# Patient Record
Sex: Female | Born: 1940 | Race: White | Hispanic: No | Marital: Single | Smoking: Never smoker
Health system: Southern US, Community
[De-identification: ages and names within clinical notes are randomized; demographics above are authoritative.]

## PROBLEM LIST (undated history)

## (undated) DIAGNOSIS — E039 Hypothyroidism, unspecified: Secondary | ICD-10-CM

## (undated) DIAGNOSIS — F419 Anxiety disorder, unspecified: Secondary | ICD-10-CM

## (undated) DIAGNOSIS — D510 Vitamin B12 deficiency anemia due to intrinsic factor deficiency: Secondary | ICD-10-CM

## (undated) DIAGNOSIS — N189 Chronic kidney disease, unspecified: Secondary | ICD-10-CM

## (undated) DIAGNOSIS — D649 Anemia, unspecified: Secondary | ICD-10-CM

## (undated) DIAGNOSIS — F330 Major depressive disorder, recurrent, mild: Secondary | ICD-10-CM

## (undated) DIAGNOSIS — L900 Lichen sclerosus et atrophicus: Secondary | ICD-10-CM

## (undated) DIAGNOSIS — I10 Essential (primary) hypertension: Secondary | ICD-10-CM

## (undated) DIAGNOSIS — M5417 Radiculopathy, lumbosacral region: Secondary | ICD-10-CM

## (undated) DIAGNOSIS — M818 Other osteoporosis without current pathological fracture: Secondary | ICD-10-CM

## (undated) DIAGNOSIS — E538 Deficiency of other specified B group vitamins: Secondary | ICD-10-CM

## (undated) DIAGNOSIS — K449 Diaphragmatic hernia without obstruction or gangrene: Secondary | ICD-10-CM

## (undated) DIAGNOSIS — F410 Panic disorder [episodic paroxysmal anxiety] without agoraphobia: Secondary | ICD-10-CM

## (undated) DIAGNOSIS — E119 Type 2 diabetes mellitus without complications: Secondary | ICD-10-CM

## (undated) DIAGNOSIS — M797 Fibromyalgia: Secondary | ICD-10-CM

## (undated) DIAGNOSIS — C4371 Malignant melanoma of right lower limb, including hip: Secondary | ICD-10-CM

## (undated) DIAGNOSIS — E785 Hyperlipidemia, unspecified: Secondary | ICD-10-CM

## (undated) DIAGNOSIS — K219 Gastro-esophageal reflux disease without esophagitis: Secondary | ICD-10-CM

## (undated) HISTORY — DX: Chronic kidney disease, unspecified: N18.9

## (undated) HISTORY — DX: Major depressive disorder, recurrent, mild: F33.0

## (undated) HISTORY — DX: Lichen sclerosus et atrophicus: L90.0

## (undated) HISTORY — PX: KNEE SURGERY: SHX244

## (undated) HISTORY — DX: Deficiency of other specified B group vitamins: E53.8

## (undated) HISTORY — DX: Hyperlipidemia, unspecified: E78.5

## (undated) HISTORY — DX: Hypothyroidism, unspecified: E03.9

## (undated) HISTORY — DX: Malignant melanoma of right lower limb, including hip: C43.71

## (undated) HISTORY — DX: Vitamin B12 deficiency anemia due to intrinsic factor deficiency: D51.0

## (undated) HISTORY — DX: Radiculopathy, lumbosacral region: M54.17

## (undated) HISTORY — PX: THYROIDECTOMY: SHX17

## (undated) HISTORY — DX: Other osteoporosis without current pathological fracture: M81.8

---

## 2004-08-14 ENCOUNTER — Ambulatory Visit: Payer: Self-pay | Admitting: Unknown Physician Specialty

## 2004-09-24 ENCOUNTER — Ambulatory Visit: Payer: Self-pay | Admitting: General Practice

## 2004-11-09 ENCOUNTER — Encounter: Payer: Self-pay | Admitting: General Practice

## 2004-11-25 ENCOUNTER — Encounter: Payer: Self-pay | Admitting: General Practice

## 2005-04-12 ENCOUNTER — Encounter: Payer: Self-pay | Admitting: Internal Medicine

## 2005-04-24 ENCOUNTER — Encounter: Payer: Self-pay | Admitting: Internal Medicine

## 2005-05-25 ENCOUNTER — Encounter: Payer: Self-pay | Admitting: Internal Medicine

## 2005-06-25 ENCOUNTER — Encounter: Payer: Self-pay | Admitting: Internal Medicine

## 2005-07-25 ENCOUNTER — Encounter: Payer: Self-pay | Admitting: Internal Medicine

## 2005-08-06 ENCOUNTER — Ambulatory Visit: Payer: Self-pay

## 2005-08-25 ENCOUNTER — Encounter: Payer: Self-pay | Admitting: Internal Medicine

## 2005-09-24 ENCOUNTER — Encounter: Payer: Self-pay | Admitting: Internal Medicine

## 2005-10-25 ENCOUNTER — Encounter: Payer: Self-pay | Admitting: Internal Medicine

## 2005-11-25 ENCOUNTER — Encounter: Payer: Self-pay | Admitting: Internal Medicine

## 2005-12-23 ENCOUNTER — Encounter: Payer: Self-pay | Admitting: Internal Medicine

## 2006-01-23 ENCOUNTER — Encounter: Payer: Self-pay | Admitting: Internal Medicine

## 2006-02-18 ENCOUNTER — Ambulatory Visit: Payer: Self-pay | Admitting: Internal Medicine

## 2006-02-22 ENCOUNTER — Encounter: Payer: Self-pay | Admitting: Internal Medicine

## 2006-03-25 ENCOUNTER — Encounter: Payer: Self-pay | Admitting: Internal Medicine

## 2006-04-24 ENCOUNTER — Encounter: Payer: Self-pay | Admitting: Internal Medicine

## 2006-05-25 ENCOUNTER — Encounter: Payer: Self-pay | Admitting: Internal Medicine

## 2006-06-25 ENCOUNTER — Encounter: Payer: Self-pay | Admitting: Internal Medicine

## 2006-07-25 ENCOUNTER — Encounter: Payer: Self-pay | Admitting: Internal Medicine

## 2006-08-08 ENCOUNTER — Ambulatory Visit: Payer: Self-pay | Admitting: Unknown Physician Specialty

## 2006-08-25 ENCOUNTER — Encounter: Payer: Self-pay | Admitting: Internal Medicine

## 2006-09-24 ENCOUNTER — Encounter: Payer: Self-pay | Admitting: Internal Medicine

## 2006-10-11 ENCOUNTER — Ambulatory Visit: Payer: Self-pay | Admitting: Internal Medicine

## 2006-10-25 ENCOUNTER — Encounter: Payer: Self-pay | Admitting: Internal Medicine

## 2006-11-25 ENCOUNTER — Encounter: Payer: Self-pay | Admitting: Internal Medicine

## 2006-12-24 ENCOUNTER — Encounter: Payer: Self-pay | Admitting: Internal Medicine

## 2007-01-24 ENCOUNTER — Encounter: Payer: Self-pay | Admitting: Internal Medicine

## 2007-02-15 ENCOUNTER — Ambulatory Visit: Payer: Self-pay | Admitting: Internal Medicine

## 2007-02-23 ENCOUNTER — Encounter: Payer: Self-pay | Admitting: Internal Medicine

## 2007-03-26 ENCOUNTER — Encounter: Payer: Self-pay | Admitting: Internal Medicine

## 2007-04-25 ENCOUNTER — Encounter: Payer: Self-pay | Admitting: Internal Medicine

## 2007-05-22 ENCOUNTER — Ambulatory Visit: Payer: Self-pay

## 2008-02-28 ENCOUNTER — Ambulatory Visit: Payer: Self-pay | Admitting: Internal Medicine

## 2008-04-10 ENCOUNTER — Ambulatory Visit: Payer: Self-pay

## 2008-08-22 ENCOUNTER — Ambulatory Visit: Payer: Self-pay | Admitting: Internal Medicine

## 2009-02-24 ENCOUNTER — Ambulatory Visit: Payer: Self-pay | Admitting: Internal Medicine

## 2009-03-05 ENCOUNTER — Ambulatory Visit: Payer: Self-pay | Admitting: Internal Medicine

## 2009-04-14 ENCOUNTER — Ambulatory Visit: Payer: Self-pay | Admitting: Unknown Physician Specialty

## 2009-06-05 ENCOUNTER — Ambulatory Visit: Payer: Self-pay | Admitting: Gastroenterology

## 2009-08-25 ENCOUNTER — Other Ambulatory Visit: Payer: Self-pay | Admitting: Internal Medicine

## 2010-02-24 ENCOUNTER — Other Ambulatory Visit: Payer: Self-pay | Admitting: Internal Medicine

## 2010-04-22 ENCOUNTER — Ambulatory Visit: Payer: Self-pay | Admitting: Unknown Physician Specialty

## 2010-09-02 ENCOUNTER — Other Ambulatory Visit: Payer: Self-pay | Admitting: Internal Medicine

## 2011-03-02 ENCOUNTER — Other Ambulatory Visit: Payer: Self-pay | Admitting: Internal Medicine

## 2011-04-29 ENCOUNTER — Ambulatory Visit: Payer: Self-pay | Admitting: Unknown Physician Specialty

## 2012-03-02 ENCOUNTER — Other Ambulatory Visit: Payer: Self-pay | Admitting: Internal Medicine

## 2012-03-02 LAB — CBC WITH DIFFERENTIAL/PLATELET
Basophil %: 0.8 %
Eosinophil #: 0.2 10*3/uL (ref 0.0–0.7)
Eosinophil %: 3 %
HCT: 34.9 % — ABNORMAL LOW (ref 35.0–47.0)
Lymphocyte #: 1.9 10*3/uL (ref 1.0–3.6)
MCH: 30 pg (ref 26.0–34.0)
MCHC: 33.1 g/dL (ref 32.0–36.0)
Monocyte #: 0.7 x10 3/mm (ref 0.2–0.9)
Monocyte %: 10.7 %
Neutrophil #: 3.9 10*3/uL (ref 1.4–6.5)
Platelet: 273 10*3/uL (ref 150–440)
RDW: 12.8 % (ref 11.5–14.5)
WBC: 6.8 10*3/uL (ref 3.6–11.0)

## 2012-03-02 LAB — LIPID PANEL
Cholesterol: 176 mg/dL (ref 0–200)
HDL Cholesterol: 67 mg/dL — ABNORMAL HIGH (ref 40–60)
Triglycerides: 131 mg/dL (ref 0–200)

## 2012-03-02 LAB — URINALYSIS, COMPLETE
Blood: NEGATIVE
Ketone: NEGATIVE
Leukocyte Esterase: NEGATIVE
Ph: 7 (ref 4.5–8.0)
RBC,UR: NONE SEEN /HPF (ref 0–5)
Specific Gravity: 1.006 (ref 1.003–1.030)
Squamous Epithelial: NONE SEEN

## 2012-03-02 LAB — COMPREHENSIVE METABOLIC PANEL
Albumin: 3.9 g/dL (ref 3.4–5.0)
Anion Gap: 8 (ref 7–16)
Bilirubin,Total: 0.4 mg/dL (ref 0.2–1.0)
Creatinine: 1 mg/dL (ref 0.60–1.30)
EGFR (Non-African Amer.): 56 — ABNORMAL LOW
Osmolality: 274 (ref 275–301)
SGOT(AST): 23 U/L (ref 15–37)
SGPT (ALT): 23 U/L
Sodium: 136 mmol/L (ref 136–145)
Total Protein: 7.2 g/dL (ref 6.4–8.2)

## 2012-05-02 ENCOUNTER — Ambulatory Visit: Payer: Self-pay | Admitting: Obstetrics and Gynecology

## 2012-09-04 ENCOUNTER — Other Ambulatory Visit: Payer: Self-pay | Admitting: Internal Medicine

## 2012-09-04 LAB — CBC WITH DIFFERENTIAL/PLATELET
Basophil #: 0 10*3/uL (ref 0.0–0.1)
Basophil %: 0.5 %
Eosinophil #: 0.1 10*3/uL (ref 0.0–0.7)
HCT: 34.8 % — ABNORMAL LOW (ref 35.0–47.0)
HGB: 11.6 g/dL — ABNORMAL LOW (ref 12.0–16.0)
Lymphocyte #: 2.2 10*3/uL (ref 1.0–3.6)
MCH: 30.5 pg (ref 26.0–34.0)
MCHC: 33.4 g/dL (ref 32.0–36.0)
MCV: 91 fL (ref 80–100)
Monocyte #: 0.8 x10 3/mm (ref 0.2–0.9)
Neutrophil #: 4.8 10*3/uL (ref 1.4–6.5)
RDW: 13.8 % (ref 11.5–14.5)

## 2012-09-04 LAB — LIPID PANEL
Cholesterol: 186 mg/dL (ref 0–200)
HDL Cholesterol: 81 mg/dL — ABNORMAL HIGH (ref 40–60)
Triglycerides: 107 mg/dL (ref 0–200)

## 2012-09-04 LAB — COMPREHENSIVE METABOLIC PANEL
Albumin: 3.7 g/dL (ref 3.4–5.0)
Alkaline Phosphatase: 80 U/L (ref 50–136)
BUN: 21 mg/dL — ABNORMAL HIGH (ref 7–18)
Bilirubin,Total: 0.4 mg/dL (ref 0.2–1.0)
Calcium, Total: 9.3 mg/dL (ref 8.5–10.1)
Co2: 26 mmol/L (ref 21–32)
EGFR (Non-African Amer.): 49 — ABNORMAL LOW
Glucose: 90 mg/dL (ref 65–99)
Osmolality: 271 (ref 275–301)
SGOT(AST): 23 U/L (ref 15–37)
SGPT (ALT): 24 U/L (ref 12–78)
Sodium: 134 mmol/L — ABNORMAL LOW (ref 136–145)

## 2013-03-13 ENCOUNTER — Other Ambulatory Visit: Payer: Self-pay | Admitting: Internal Medicine

## 2013-03-13 LAB — CBC WITH DIFFERENTIAL/PLATELET
Basophil #: 0.1 10*3/uL (ref 0.0–0.1)
Eosinophil #: 0.2 10*3/uL (ref 0.0–0.7)
Eosinophil %: 2.6 %
HCT: 33.8 % — ABNORMAL LOW (ref 35.0–47.0)
HGB: 11.4 g/dL — ABNORMAL LOW (ref 12.0–16.0)
Lymphocyte #: 2.1 10*3/uL (ref 1.0–3.6)
MCH: 30 pg (ref 26.0–34.0)
MCHC: 33.9 g/dL (ref 32.0–36.0)
MCV: 88 fL (ref 80–100)
Neutrophil #: 4.1 10*3/uL (ref 1.4–6.5)
Platelet: 289 10*3/uL (ref 150–440)
RBC: 3.82 10*6/uL (ref 3.80–5.20)
RDW: 13.1 % (ref 11.5–14.5)

## 2013-03-13 LAB — URINALYSIS, COMPLETE
Bilirubin,UR: NEGATIVE
Blood: NEGATIVE
Glucose,UR: NEGATIVE mg/dL (ref 0–75)
Ketone: NEGATIVE
Protein: NEGATIVE
RBC,UR: NONE SEEN /HPF (ref 0–5)
Squamous Epithelial: <1

## 2013-03-13 LAB — COMPREHENSIVE METABOLIC PANEL
Albumin: 3.8 g/dL (ref 3.4–5.0)
Anion Gap: 6 — ABNORMAL LOW (ref 7–16)
Bilirubin,Total: 0.4 mg/dL (ref 0.2–1.0)
Chloride: 103 mmol/L (ref 98–107)
Creatinine: 1.1 mg/dL (ref 0.60–1.30)
EGFR (African American): 58 — ABNORMAL LOW
EGFR (Non-African Amer.): 50 — ABNORMAL LOW
Glucose: 99 mg/dL (ref 65–99)
Osmolality: 273 (ref 275–301)
Potassium: 4.9 mmol/L (ref 3.5–5.1)
SGOT(AST): 28 U/L (ref 15–37)
Sodium: 135 mmol/L — ABNORMAL LOW (ref 136–145)
Total Protein: 7.2 g/dL (ref 6.4–8.2)

## 2013-03-13 LAB — LIPID PANEL: Ldl Cholesterol, Calc: 87 mg/dL (ref 0–100)

## 2013-03-27 ENCOUNTER — Ambulatory Visit: Payer: Self-pay | Admitting: Internal Medicine

## 2013-05-03 ENCOUNTER — Ambulatory Visit: Payer: Self-pay | Admitting: Obstetrics and Gynecology

## 2014-02-09 DIAGNOSIS — K219 Gastro-esophageal reflux disease without esophagitis: Secondary | ICD-10-CM | POA: Insufficient documentation

## 2014-02-09 DIAGNOSIS — L900 Lichen sclerosus et atrophicus: Secondary | ICD-10-CM | POA: Insufficient documentation

## 2014-02-09 DIAGNOSIS — I10 Essential (primary) hypertension: Secondary | ICD-10-CM

## 2014-03-20 ENCOUNTER — Other Ambulatory Visit: Payer: Self-pay | Admitting: Internal Medicine

## 2014-03-20 LAB — URINALYSIS, COMPLETE
BACTERIA: NONE SEEN
Bilirubin,UR: NEGATIVE
Blood: NEGATIVE
GLUCOSE, UR: NEGATIVE mg/dL (ref 0–75)
KETONE: NEGATIVE
Leukocyte Esterase: NEGATIVE
Nitrite: NEGATIVE
Ph: 6 (ref 4.5–8.0)
Protein: NEGATIVE
RBC, UR: NONE SEEN /HPF (ref 0–5)
SPECIFIC GRAVITY: 1.011 (ref 1.003–1.030)
SQUAMOUS EPITHELIAL: NONE SEEN

## 2014-03-20 LAB — COMPREHENSIVE METABOLIC PANEL
ALK PHOS: 77 U/L
AST: 18 U/L (ref 15–37)
Albumin: 3.7 g/dL (ref 3.4–5.0)
Anion Gap: 7 (ref 7–16)
BUN: 22 mg/dL — AB (ref 7–18)
Bilirubin,Total: 0.5 mg/dL (ref 0.2–1.0)
Calcium, Total: 9.4 mg/dL (ref 8.5–10.1)
Chloride: 102 mmol/L (ref 98–107)
Co2: 26 mmol/L (ref 21–32)
Creatinine: 1.17 mg/dL (ref 0.60–1.30)
EGFR (Non-African Amer.): 46 — ABNORMAL LOW
GFR CALC AF AMER: 53 — AB
GLUCOSE: 103 mg/dL — AB (ref 65–99)
OSMOLALITY: 274 (ref 275–301)
Potassium: 4.6 mmol/L (ref 3.5–5.1)
SGPT (ALT): 18 U/L (ref 12–78)
Sodium: 135 mmol/L — ABNORMAL LOW (ref 136–145)
Total Protein: 6.9 g/dL (ref 6.4–8.2)

## 2014-03-20 LAB — CBC WITH DIFFERENTIAL/PLATELET
Basophil #: 0 10*3/uL (ref 0.0–0.1)
Basophil %: 0.8 %
Eosinophil #: 0.2 10*3/uL (ref 0.0–0.7)
Eosinophil %: 3.4 %
HCT: 33.8 % — ABNORMAL LOW (ref 35.0–47.0)
HGB: 11.2 g/dL — ABNORMAL LOW (ref 12.0–16.0)
LYMPHS PCT: 28.3 %
Lymphocyte #: 1.8 10*3/uL (ref 1.0–3.6)
MCH: 30.1 pg (ref 26.0–34.0)
MCHC: 33.1 g/dL (ref 32.0–36.0)
MCV: 91 fL (ref 80–100)
MONOS PCT: 10.9 %
Monocyte #: 0.7 x10 3/mm (ref 0.2–0.9)
NEUTROS ABS: 3.5 10*3/uL (ref 1.4–6.5)
Neutrophil %: 56.6 %
PLATELETS: 268 10*3/uL (ref 150–440)
RBC: 3.72 10*6/uL — ABNORMAL LOW (ref 3.80–5.20)
RDW: 13 % (ref 11.5–14.5)
WBC: 6.3 10*3/uL (ref 3.6–11.0)

## 2014-03-20 LAB — LIPID PANEL
CHOLESTEROL: 209 mg/dL — AB (ref 0–200)
HDL Cholesterol: 71 mg/dL — ABNORMAL HIGH (ref 40–60)
Ldl Cholesterol, Calc: 110 mg/dL — ABNORMAL HIGH (ref 0–100)
Triglycerides: 139 mg/dL (ref 0–200)
VLDL Cholesterol, Calc: 28 mg/dL (ref 5–40)

## 2014-03-20 LAB — TSH: Thyroid Stimulating Horm: 1.18 u[IU]/mL

## 2014-05-06 ENCOUNTER — Ambulatory Visit: Payer: Self-pay | Admitting: Obstetrics and Gynecology

## 2014-12-13 DIAGNOSIS — M5417 Radiculopathy, lumbosacral region: Secondary | ICD-10-CM | POA: Insufficient documentation

## 2015-03-13 ENCOUNTER — Other Ambulatory Visit
Admission: RE | Admit: 2015-03-13 | Discharge: 2015-03-13 | Disposition: A | Discharge status: Home / Self Care | Payer: Medicare Other | Source: Ambulatory Visit | Attending: Internal Medicine | Admitting: Internal Medicine

## 2015-03-13 DIAGNOSIS — E782 Mixed hyperlipidemia: Secondary | ICD-10-CM | POA: Insufficient documentation

## 2015-03-13 LAB — COMPREHENSIVE METABOLIC PANEL
ALT: 16 U/L (ref 14–54)
AST: 21 U/L (ref 15–41)
Albumin: 4 g/dL (ref 3.5–5.0)
Alkaline Phosphatase: 64 U/L (ref 38–126)
Anion gap: 6 (ref 5–15)
BUN: 23 mg/dL — ABNORMAL HIGH (ref 6–20)
CALCIUM: 9.3 mg/dL (ref 8.9–10.3)
CO2: 28 mmol/L (ref 22–32)
CREATININE: 1.24 mg/dL — AB (ref 0.44–1.00)
Chloride: 101 mmol/L (ref 101–111)
GFR, EST AFRICAN AMERICAN: 48 mL/min — AB (ref >60)
GFR, EST NON AFRICAN AMERICAN: 41 mL/min — AB (ref >60)
Glucose, Bld: 113 mg/dL — ABNORMAL HIGH (ref 65–99)
Potassium: 4.5 mmol/L (ref 3.5–5.1)
Sodium: 135 mmol/L (ref 135–145)
Total Bilirubin: 0.3 mg/dL (ref 0.3–1.2)
Total Protein: 6.6 g/dL (ref 6.5–8.1)

## 2015-03-13 LAB — LIPID PANEL
CHOLESTEROL: 213 mg/dL — AB (ref 0–200)
HDL: 66 mg/dL (ref >40)
LDL CALC: 127 mg/dL — AB (ref 0–99)
Total CHOL/HDL Ratio: 3.2 RATIO
Triglycerides: 98 mg/dL (ref <150)
VLDL: 20 mg/dL (ref 0–40)

## 2015-03-13 LAB — CBC
HCT: 33.8 % — ABNORMAL LOW (ref 35.0–47.0)
Hemoglobin: 11.2 g/dL — ABNORMAL LOW (ref 12.0–16.0)
MCH: 30.1 pg (ref 26.0–34.0)
MCHC: 33.1 g/dL (ref 32.0–36.0)
MCV: 91 fL (ref 80.0–100.0)
PLATELETS: 263 10*3/uL (ref 150–440)
RBC: 3.71 MIL/uL — ABNORMAL LOW (ref 3.80–5.20)
RDW: 13.6 % (ref 11.5–14.5)
WBC: 6.8 10*3/uL (ref 3.6–11.0)

## 2015-03-13 LAB — TSH: TSH: 0.245 u[IU]/mL — AB (ref 0.350–4.500)

## 2015-03-13 LAB — URINALYSIS COMPLETE WITH MICROSCOPIC (ARMC ONLY)
Bacteria, UA: NONE SEEN
Bilirubin Urine: NEGATIVE
Glucose, UA: NEGATIVE mg/dL
Hgb urine dipstick: NEGATIVE
KETONES UR: NEGATIVE mg/dL
Leukocytes, UA: NEGATIVE
Nitrite: NEGATIVE
PH: 7 (ref 5.0–8.0)
PROTEIN: NEGATIVE mg/dL
Specific Gravity, Urine: 1.009 (ref 1.005–1.030)
Squamous Epithelial / LPF: NONE SEEN

## 2015-03-13 LAB — MAGNESIUM: Magnesium: 1.8 mg/dL (ref 1.7–2.4)

## 2015-04-24 ENCOUNTER — Other Ambulatory Visit: Payer: Self-pay | Admitting: Obstetrics & Gynecology

## 2015-04-24 DIAGNOSIS — Z1239 Encounter for other screening for malignant neoplasm of breast: Secondary | ICD-10-CM

## 2015-05-19 ENCOUNTER — Other Ambulatory Visit: Payer: Self-pay | Admitting: Obstetrics & Gynecology

## 2015-05-19 DIAGNOSIS — Z1231 Encounter for screening mammogram for malignant neoplasm of breast: Secondary | ICD-10-CM

## 2015-05-20 ENCOUNTER — Other Ambulatory Visit: Payer: Self-pay | Admitting: Obstetrics & Gynecology

## 2015-05-20 ENCOUNTER — Ambulatory Visit
Admission: RE | Admit: 2015-05-20 | Discharge: 2015-05-20 | Disposition: A | Discharge status: Home / Self Care | Payer: Medicare Other | Source: Ambulatory Visit | Attending: Obstetrics & Gynecology | Admitting: Obstetrics & Gynecology

## 2015-05-20 DIAGNOSIS — Z1231 Encounter for screening mammogram for malignant neoplasm of breast: Secondary | ICD-10-CM | POA: Insufficient documentation

## 2015-10-01 ENCOUNTER — Other Ambulatory Visit
Admission: RE | Admit: 2015-10-01 | Discharge: 2015-10-01 | Disposition: A | Discharge status: Home / Self Care | Payer: Medicare Other | Source: Ambulatory Visit | Attending: Internal Medicine | Admitting: Internal Medicine

## 2015-10-01 DIAGNOSIS — E782 Mixed hyperlipidemia: Secondary | ICD-10-CM | POA: Diagnosis not present

## 2015-10-01 DIAGNOSIS — D649 Anemia, unspecified: Secondary | ICD-10-CM | POA: Insufficient documentation

## 2015-10-01 LAB — COMPREHENSIVE METABOLIC PANEL
ALBUMIN: 3.9 g/dL (ref 3.5–5.0)
ALT: 15 U/L (ref 14–54)
AST: 21 U/L (ref 15–41)
Alkaline Phosphatase: 62 U/L (ref 38–126)
Anion gap: 6 (ref 5–15)
BUN: 22 mg/dL — AB (ref 6–20)
CHLORIDE: 103 mmol/L (ref 101–111)
CO2: 26 mmol/L (ref 22–32)
Calcium: 9.2 mg/dL (ref 8.9–10.3)
Creatinine, Ser: 1.19 mg/dL — ABNORMAL HIGH (ref 0.44–1.00)
GFR calc Af Amer: 50 mL/min — ABNORMAL LOW (ref >60)
GFR calc non Af Amer: 43 mL/min — ABNORMAL LOW (ref >60)
GLUCOSE: 113 mg/dL — AB (ref 65–99)
POTASSIUM: 4 mmol/L (ref 3.5–5.1)
Sodium: 135 mmol/L (ref 135–145)
Total Bilirubin: 0.8 mg/dL (ref 0.3–1.2)
Total Protein: 7 g/dL (ref 6.5–8.1)

## 2015-10-01 LAB — CBC
HCT: 34.6 % — ABNORMAL LOW (ref 35.0–47.0)
Hemoglobin: 11.4 g/dL — ABNORMAL LOW (ref 12.0–16.0)
MCH: 30.1 pg (ref 26.0–34.0)
MCHC: 33 g/dL (ref 32.0–36.0)
MCV: 91.1 fL (ref 80.0–100.0)
PLATELETS: 281 10*3/uL (ref 150–440)
RBC: 3.8 MIL/uL (ref 3.80–5.20)
RDW: 13.2 % (ref 11.5–14.5)
WBC: 6.4 10*3/uL (ref 3.6–11.0)

## 2015-10-01 LAB — LIPID PANEL
Cholesterol: 228 mg/dL — ABNORMAL HIGH (ref 0–200)
HDL: 67 mg/dL (ref >40)
LDL CALC: 137 mg/dL — AB (ref 0–99)
Total CHOL/HDL Ratio: 3.4 RATIO
Triglycerides: 118 mg/dL (ref <150)
VLDL: 24 mg/dL (ref 0–40)

## 2015-10-01 LAB — FERRITIN: FERRITIN: 49 ng/mL (ref 11–307)

## 2016-03-16 ENCOUNTER — Other Ambulatory Visit
Admission: RE | Admit: 2016-03-16 | Discharge: 2016-03-16 | Disposition: A | Discharge status: Home / Self Care | Payer: Medicare Other | Source: Ambulatory Visit | Attending: Internal Medicine | Admitting: Internal Medicine

## 2016-03-16 DIAGNOSIS — R000 Tachycardia, unspecified: Secondary | ICD-10-CM | POA: Diagnosis present

## 2016-03-16 DIAGNOSIS — R5383 Other fatigue: Secondary | ICD-10-CM | POA: Diagnosis present

## 2016-03-16 LAB — URINALYSIS COMPLETE WITH MICROSCOPIC (ARMC ONLY)
BACTERIA UA: NONE SEEN
BILIRUBIN URINE: NEGATIVE
Glucose, UA: NEGATIVE mg/dL
Hgb urine dipstick: NEGATIVE
Ketones, ur: NEGATIVE mg/dL
Leukocytes, UA: NEGATIVE
Nitrite: NEGATIVE
PH: 6 (ref 5.0–8.0)
Protein, ur: NEGATIVE mg/dL
SQUAMOUS EPITHELIAL / LPF: NONE SEEN
Specific Gravity, Urine: 1.016 (ref 1.005–1.030)
WBC, UA: NONE SEEN WBC/hpf (ref 0–5)

## 2016-03-16 LAB — COMPREHENSIVE METABOLIC PANEL
ALBUMIN: 4.4 g/dL (ref 3.5–5.0)
ALK PHOS: 62 U/L (ref 38–126)
ALT: 17 U/L (ref 14–54)
AST: 23 U/L (ref 15–41)
Anion gap: 7 (ref 5–15)
BUN: 26 mg/dL — AB (ref 6–20)
CALCIUM: 9.7 mg/dL (ref 8.9–10.3)
CO2: 26 mmol/L (ref 22–32)
CREATININE: 1.33 mg/dL — AB (ref 0.44–1.00)
Chloride: 104 mmol/L (ref 101–111)
GFR calc Af Amer: 44 mL/min — ABNORMAL LOW (ref >60)
GFR calc non Af Amer: 38 mL/min — ABNORMAL LOW (ref >60)
GLUCOSE: 117 mg/dL — AB (ref 65–99)
Potassium: 4.7 mmol/L (ref 3.5–5.1)
Sodium: 137 mmol/L (ref 135–145)
Total Bilirubin: 0.5 mg/dL (ref 0.3–1.2)
Total Protein: 7 g/dL (ref 6.5–8.1)

## 2016-03-16 LAB — CBC
HCT: 33.9 % — ABNORMAL LOW (ref 35.0–47.0)
HEMOGLOBIN: 11.6 g/dL — AB (ref 12.0–16.0)
MCH: 30.9 pg (ref 26.0–34.0)
MCHC: 34.3 g/dL (ref 32.0–36.0)
MCV: 90.2 fL (ref 80.0–100.0)
Platelets: 304 10*3/uL (ref 150–440)
RBC: 3.76 MIL/uL — ABNORMAL LOW (ref 3.80–5.20)
RDW: 13.6 % (ref 11.5–14.5)
WBC: 6.8 10*3/uL (ref 3.6–11.0)

## 2016-03-16 LAB — HEMOGLOBIN A1C: Hgb A1c MFr Bld: 6 % (ref 4.0–6.0)

## 2016-03-16 LAB — FERRITIN: FERRITIN: 41 ng/mL (ref 11–307)

## 2016-03-16 LAB — VITAMIN B12: Vitamin B-12: 246 pg/mL (ref 180–914)

## 2016-03-16 LAB — TSH: TSH: 1.054 u[IU]/mL (ref 0.350–4.500)

## 2016-03-16 LAB — T4, FREE: FREE T4: 0.93 ng/dL (ref 0.61–1.12)

## 2016-03-31 DIAGNOSIS — E039 Hypothyroidism, unspecified: Secondary | ICD-10-CM | POA: Insufficient documentation

## 2016-03-31 DIAGNOSIS — E782 Mixed hyperlipidemia: Secondary | ICD-10-CM | POA: Insufficient documentation

## 2016-06-04 ENCOUNTER — Other Ambulatory Visit: Payer: Self-pay | Admitting: Obstetrics & Gynecology

## 2016-06-04 DIAGNOSIS — Z1231 Encounter for screening mammogram for malignant neoplasm of breast: Secondary | ICD-10-CM

## 2016-06-25 ENCOUNTER — Other Ambulatory Visit: Payer: Self-pay | Admitting: Obstetrics & Gynecology

## 2016-06-25 ENCOUNTER — Ambulatory Visit
Admission: RE | Admit: 2016-06-25 | Discharge: 2016-06-25 | Disposition: A | Discharge status: Home / Self Care | Payer: Medicare Other | Source: Ambulatory Visit | Attending: Obstetrics & Gynecology | Admitting: Obstetrics & Gynecology

## 2016-06-25 DIAGNOSIS — Z1231 Encounter for screening mammogram for malignant neoplasm of breast: Secondary | ICD-10-CM | POA: Diagnosis present

## 2016-09-14 ENCOUNTER — Other Ambulatory Visit
Admission: RE | Admit: 2016-09-14 | Discharge: 2016-09-14 | Disposition: A | Discharge status: Home / Self Care | Payer: Medicare Other | Source: Ambulatory Visit | Attending: Internal Medicine | Admitting: Internal Medicine

## 2016-09-14 DIAGNOSIS — E119 Type 2 diabetes mellitus without complications: Secondary | ICD-10-CM | POA: Diagnosis present

## 2016-09-14 LAB — COMPREHENSIVE METABOLIC PANEL
ALK PHOS: 63 U/L (ref 38–126)
ALT: 15 U/L (ref 14–54)
AST: 20 U/L (ref 15–41)
Albumin: 4 g/dL (ref 3.5–5.0)
Anion gap: 6 (ref 5–15)
BUN: 30 mg/dL — AB (ref 6–20)
CALCIUM: 9.5 mg/dL (ref 8.9–10.3)
CHLORIDE: 105 mmol/L (ref 101–111)
CO2: 26 mmol/L (ref 22–32)
CREATININE: 1.45 mg/dL — AB (ref 0.44–1.00)
GFR calc Af Amer: 39 mL/min — ABNORMAL LOW (ref >60)
GFR, EST NON AFRICAN AMERICAN: 34 mL/min — AB (ref >60)
Glucose, Bld: 104 mg/dL — ABNORMAL HIGH (ref 65–99)
Potassium: 4.6 mmol/L (ref 3.5–5.1)
SODIUM: 137 mmol/L (ref 135–145)
Total Bilirubin: 0.6 mg/dL (ref 0.3–1.2)
Total Protein: 6.7 g/dL (ref 6.5–8.1)

## 2016-09-14 LAB — CBC
HCT: 34.7 % — ABNORMAL LOW (ref 35.0–47.0)
HEMOGLOBIN: 11.7 g/dL — AB (ref 12.0–16.0)
MCH: 30.7 pg (ref 26.0–34.0)
MCHC: 33.5 g/dL (ref 32.0–36.0)
MCV: 91.6 fL (ref 80.0–100.0)
PLATELETS: 242 10*3/uL (ref 150–440)
RBC: 3.79 MIL/uL — AB (ref 3.80–5.20)
RDW: 14.5 % (ref 11.5–14.5)
WBC: 7.3 10*3/uL (ref 3.6–11.0)

## 2016-09-14 LAB — LIPID PANEL
CHOL/HDL RATIO: 3 ratio
Cholesterol: 200 mg/dL (ref 0–200)
HDL: 67 mg/dL (ref >40)
LDL Cholesterol: 112 mg/dL — ABNORMAL HIGH (ref 0–99)
Triglycerides: 103 mg/dL (ref <150)
VLDL: 21 mg/dL (ref 0–40)

## 2016-09-14 LAB — TSH: TSH: 0.729 u[IU]/mL (ref 0.350–4.500)

## 2016-09-15 LAB — HEMOGLOBIN A1C
HEMOGLOBIN A1C: 6.2 % — AB (ref 4.8–5.6)
MEAN PLASMA GLUCOSE: 131 mg/dL

## 2017-03-25 ENCOUNTER — Other Ambulatory Visit
Admission: RE | Admit: 2017-03-25 | Discharge: 2017-03-25 | Disposition: A | Discharge status: Home / Self Care | Payer: Medicare Other | Source: Ambulatory Visit | Attending: Internal Medicine | Admitting: Internal Medicine

## 2017-03-25 DIAGNOSIS — R000 Tachycardia, unspecified: Secondary | ICD-10-CM | POA: Insufficient documentation

## 2017-03-25 DIAGNOSIS — M1990 Unspecified osteoarthritis, unspecified site: Secondary | ICD-10-CM | POA: Insufficient documentation

## 2017-03-25 LAB — TSH: TSH: 1.158 u[IU]/mL (ref 0.350–4.500)

## 2017-03-25 LAB — URINALYSIS, ROUTINE W REFLEX MICROSCOPIC
BILIRUBIN URINE: NEGATIVE
Glucose, UA: NEGATIVE mg/dL
Hgb urine dipstick: NEGATIVE
KETONES UR: NEGATIVE mg/dL
Leukocytes, UA: NEGATIVE
NITRITE: NEGATIVE
PROTEIN: NEGATIVE mg/dL
SPECIFIC GRAVITY, URINE: 1.008 (ref 1.005–1.030)
pH: 6 (ref 5.0–8.0)

## 2017-03-25 LAB — COMPREHENSIVE METABOLIC PANEL
ALT: 17 U/L (ref 14–54)
AST: 23 U/L (ref 15–41)
Albumin: 4.2 g/dL (ref 3.5–5.0)
Alkaline Phosphatase: 75 U/L (ref 38–126)
Anion gap: 8 (ref 5–15)
BUN: 25 mg/dL — ABNORMAL HIGH (ref 6–20)
CALCIUM: 9.8 mg/dL (ref 8.9–10.3)
CHLORIDE: 103 mmol/L (ref 101–111)
CO2: 25 mmol/L (ref 22–32)
CREATININE: 1.29 mg/dL — AB (ref 0.44–1.00)
GFR, EST AFRICAN AMERICAN: 45 mL/min — AB (ref >60)
GFR, EST NON AFRICAN AMERICAN: 39 mL/min — AB (ref >60)
Glucose, Bld: 100 mg/dL — ABNORMAL HIGH (ref 65–99)
Potassium: 4.2 mmol/L (ref 3.5–5.1)
Sodium: 136 mmol/L (ref 135–145)
Total Bilirubin: 0.8 mg/dL (ref 0.3–1.2)
Total Protein: 7.5 g/dL (ref 6.5–8.1)

## 2017-03-25 LAB — CBC
HCT: 32.2 % — ABNORMAL LOW (ref 35.0–47.0)
Hemoglobin: 10.7 g/dL — ABNORMAL LOW (ref 12.0–16.0)
MCH: 27.7 pg (ref 26.0–34.0)
MCHC: 33.3 g/dL (ref 32.0–36.0)
MCV: 83.1 fL (ref 80.0–100.0)
PLATELETS: 273 10*3/uL (ref 150–440)
RBC: 3.88 MIL/uL (ref 3.80–5.20)
RDW: 16 % — ABNORMAL HIGH (ref 11.5–14.5)
WBC: 7.7 10*3/uL (ref 3.6–11.0)

## 2017-03-25 LAB — LIPID PANEL
Cholesterol: 181 mg/dL (ref 0–200)
HDL: 67 mg/dL (ref >40)
LDL CALC: 97 mg/dL (ref 0–99)
Total CHOL/HDL Ratio: 2.7 RATIO
Triglycerides: 85 mg/dL (ref <150)
VLDL: 17 mg/dL (ref 0–40)

## 2017-03-25 LAB — FERRITIN: Ferritin: 58 ng/mL (ref 11–307)

## 2017-03-25 LAB — VITAMIN B12: Vitamin B-12: 221 pg/mL (ref 180–914)

## 2017-03-26 LAB — HEMOGLOBIN A1C
HEMOGLOBIN A1C: 6.1 % — AB (ref 4.8–5.6)
MEAN PLASMA GLUCOSE: 128 mg/dL

## 2017-03-26 LAB — MICROALBUMIN, URINE: Microalb, Ur: <3 ug/mL — ABNORMAL HIGH

## 2017-04-14 DIAGNOSIS — M818 Other osteoporosis without current pathological fracture: Secondary | ICD-10-CM | POA: Insufficient documentation

## 2017-07-20 ENCOUNTER — Other Ambulatory Visit: Payer: Self-pay | Admitting: Obstetrics & Gynecology

## 2017-07-20 DIAGNOSIS — Z1231 Encounter for screening mammogram for malignant neoplasm of breast: Secondary | ICD-10-CM

## 2017-08-16 ENCOUNTER — Ambulatory Visit
Admission: RE | Admit: 2017-08-16 | Discharge: 2017-08-16 | Disposition: A | Discharge status: Home / Self Care | Payer: Medicare Other | Source: Ambulatory Visit | Attending: Obstetrics & Gynecology | Admitting: Obstetrics & Gynecology

## 2017-08-16 DIAGNOSIS — Z1231 Encounter for screening mammogram for malignant neoplasm of breast: Secondary | ICD-10-CM | POA: Diagnosis present

## 2017-09-27 ENCOUNTER — Other Ambulatory Visit
Admission: RE | Admit: 2017-09-27 | Discharge: 2017-09-27 | Disposition: A | Discharge status: Home / Self Care | Payer: Medicare Other | Source: Ambulatory Visit | Attending: Internal Medicine | Admitting: Internal Medicine

## 2017-09-27 DIAGNOSIS — E119 Type 2 diabetes mellitus without complications: Secondary | ICD-10-CM | POA: Insufficient documentation

## 2017-09-27 LAB — COMPREHENSIVE METABOLIC PANEL
ALBUMIN: 4 g/dL (ref 3.5–5.0)
ALK PHOS: 73 U/L (ref 38–126)
ALT: 15 U/L (ref 14–54)
ANION GAP: 9 (ref 5–15)
AST: 20 U/L (ref 15–41)
BUN: 28 mg/dL — ABNORMAL HIGH (ref 6–20)
CALCIUM: 9.6 mg/dL (ref 8.9–10.3)
CO2: 24 mmol/L (ref 22–32)
Chloride: 103 mmol/L (ref 101–111)
Creatinine, Ser: 1.15 mg/dL — ABNORMAL HIGH (ref 0.44–1.00)
GFR calc Af Amer: 51 mL/min — ABNORMAL LOW (ref >60)
GFR calc non Af Amer: 44 mL/min — ABNORMAL LOW (ref >60)
Glucose, Bld: 98 mg/dL (ref 65–99)
POTASSIUM: 4.6 mmol/L (ref 3.5–5.1)
SODIUM: 136 mmol/L (ref 135–145)
TOTAL PROTEIN: 6.8 g/dL (ref 6.5–8.1)
Total Bilirubin: 0.6 mg/dL (ref 0.3–1.2)

## 2017-09-27 LAB — CBC WITH DIFFERENTIAL/PLATELET
BASOS ABS: 0.1 10*3/uL (ref 0–0.1)
BASOS PCT: 1 %
EOS ABS: 0.2 10*3/uL (ref 0–0.7)
Eosinophils Relative: 3 %
HCT: 32.2 % — ABNORMAL LOW (ref 35.0–47.0)
HEMOGLOBIN: 10.6 g/dL — AB (ref 12.0–16.0)
Lymphocytes Relative: 27 %
Lymphs Abs: 1.7 10*3/uL (ref 1.0–3.6)
MCH: 30.1 pg (ref 26.0–34.0)
MCHC: 32.9 g/dL (ref 32.0–36.0)
MCV: 91.4 fL (ref 80.0–100.0)
Monocytes Absolute: 0.7 10*3/uL (ref 0.2–0.9)
Monocytes Relative: 11 %
NEUTROS PCT: 58 %
Neutro Abs: 3.8 10*3/uL (ref 1.4–6.5)
Platelets: 254 10*3/uL (ref 150–440)
RBC: 3.52 MIL/uL — ABNORMAL LOW (ref 3.80–5.20)
RDW: 13.5 % (ref 11.5–14.5)
WBC: 6.5 10*3/uL (ref 3.6–11.0)

## 2017-09-27 LAB — LIPID PANEL
CHOLESTEROL: 180 mg/dL (ref 0–200)
HDL: 71 mg/dL (ref >40)
LDL Cholesterol: 94 mg/dL (ref 0–99)
Total CHOL/HDL Ratio: 2.5 RATIO
Triglycerides: 73 mg/dL (ref <150)
VLDL: 15 mg/dL (ref 0–40)

## 2017-09-27 LAB — TSH: TSH: 1.268 u[IU]/mL (ref 0.350–4.500)

## 2017-09-27 LAB — HEMOGLOBIN A1C
HEMOGLOBIN A1C: 6 % — AB (ref 4.8–5.6)
MEAN PLASMA GLUCOSE: 125.5 mg/dL

## 2018-03-31 ENCOUNTER — Other Ambulatory Visit
Admission: RE | Admit: 2018-03-31 | Discharge: 2018-03-31 | Disposition: A | Discharge status: Home / Self Care | Payer: Medicare Other | Source: Ambulatory Visit | Attending: Internal Medicine | Admitting: Internal Medicine

## 2018-03-31 DIAGNOSIS — E119 Type 2 diabetes mellitus without complications: Secondary | ICD-10-CM | POA: Diagnosis present

## 2018-03-31 DIAGNOSIS — E782 Mixed hyperlipidemia: Secondary | ICD-10-CM | POA: Insufficient documentation

## 2018-03-31 LAB — URINALYSIS, COMPLETE (UACMP) WITH MICROSCOPIC
BACTERIA UA: NONE SEEN
BILIRUBIN URINE: NEGATIVE
Glucose, UA: NEGATIVE mg/dL
Hgb urine dipstick: NEGATIVE
KETONES UR: NEGATIVE mg/dL
Leukocytes, UA: NEGATIVE
NITRITE: NEGATIVE
Protein, ur: NEGATIVE mg/dL
Specific Gravity, Urine: 1.009 (ref 1.005–1.030)
Squamous Epithelial / LPF: NONE SEEN (ref 0–5)
pH: 6 (ref 5.0–8.0)

## 2018-03-31 LAB — CBC WITH DIFFERENTIAL/PLATELET
BASOS ABS: 0 10*3/uL (ref 0–0.1)
Basophils Relative: 1 %
Eosinophils Absolute: 0.1 10*3/uL (ref 0–0.7)
Eosinophils Relative: 2 %
HEMATOCRIT: 31.1 % — AB (ref 35.0–47.0)
HEMOGLOBIN: 10.6 g/dL — AB (ref 12.0–16.0)
LYMPHS PCT: 27 %
Lymphs Abs: 1.7 10*3/uL (ref 1.0–3.6)
MCH: 30.4 pg (ref 26.0–34.0)
MCHC: 33.9 g/dL (ref 32.0–36.0)
MCV: 89.7 fL (ref 80.0–100.0)
Monocytes Absolute: 0.6 10*3/uL (ref 0.2–0.9)
Monocytes Relative: 10 %
NEUTROS ABS: 3.7 10*3/uL (ref 1.4–6.5)
NEUTROS PCT: 60 %
Platelets: 252 10*3/uL (ref 150–440)
RBC: 3.47 MIL/uL — AB (ref 3.80–5.20)
RDW: 13.4 % (ref 11.5–14.5)
WBC: 6.2 10*3/uL (ref 3.6–11.0)

## 2018-03-31 LAB — COMPREHENSIVE METABOLIC PANEL
ALK PHOS: 66 U/L (ref 38–126)
ALT: 16 U/L (ref 14–54)
AST: 19 U/L (ref 15–41)
Albumin: 4 g/dL (ref 3.5–5.0)
Anion gap: 7 (ref 5–15)
BILIRUBIN TOTAL: 0.6 mg/dL (ref 0.3–1.2)
BUN: 34 mg/dL — AB (ref 6–20)
CO2: 25 mmol/L (ref 22–32)
CREATININE: 1.4 mg/dL — AB (ref 0.44–1.00)
Calcium: 9.3 mg/dL (ref 8.9–10.3)
Chloride: 104 mmol/L (ref 101–111)
GFR calc Af Amer: 41 mL/min — ABNORMAL LOW (ref >60)
GFR calc non Af Amer: 35 mL/min — ABNORMAL LOW (ref >60)
GLUCOSE: 100 mg/dL — AB (ref 65–99)
POTASSIUM: 4.7 mmol/L (ref 3.5–5.1)
Sodium: 136 mmol/L (ref 135–145)
TOTAL PROTEIN: 6.6 g/dL (ref 6.5–8.1)

## 2018-03-31 LAB — LIPID PANEL
Cholesterol: 172 mg/dL (ref 0–200)
HDL: 65 mg/dL (ref >40)
LDL CALC: 92 mg/dL (ref 0–99)
Total CHOL/HDL Ratio: 2.6 RATIO
Triglycerides: 73 mg/dL (ref <150)
VLDL: 15 mg/dL (ref 0–40)

## 2018-03-31 LAB — TSH: TSH: 1.932 u[IU]/mL (ref 0.350–4.500)

## 2018-03-31 LAB — HEMOGLOBIN A1C
Hgb A1c MFr Bld: 6.2 % — ABNORMAL HIGH (ref 4.8–5.6)
Mean Plasma Glucose: 131.24 mg/dL

## 2018-04-01 LAB — MICROALBUMIN / CREATININE URINE RATIO
Creatinine, Urine: 52 mg/dL
Microalb, Ur: <3 ug/mL — ABNORMAL HIGH

## 2018-04-19 DIAGNOSIS — Z0000 Encounter for general adult medical examination without abnormal findings: Secondary | ICD-10-CM | POA: Insufficient documentation

## 2018-05-11 ENCOUNTER — Encounter (HOSPITAL_COMMUNITY): Payer: Self-pay | Admitting: Emergency Medicine

## 2018-05-11 ENCOUNTER — Inpatient Hospital Stay (HOSPITAL_COMMUNITY)
Admission: EM | Admit: 2018-05-11 | Discharge: 2018-05-15 | DRG: 643 | Disposition: A | Discharge status: Home Health | Payer: Medicare Other | Attending: Internal Medicine | Admitting: Internal Medicine

## 2018-05-11 DIAGNOSIS — I10 Essential (primary) hypertension: Secondary | ICD-10-CM | POA: Diagnosis present

## 2018-05-11 DIAGNOSIS — Z66 Do not resuscitate: Secondary | ICD-10-CM | POA: Diagnosis present

## 2018-05-11 DIAGNOSIS — E876 Hypokalemia: Secondary | ICD-10-CM | POA: Diagnosis present

## 2018-05-11 DIAGNOSIS — E222 Syndrome of inappropriate secretion of antidiuretic hormone: Secondary | ICD-10-CM | POA: Diagnosis present

## 2018-05-11 DIAGNOSIS — M797 Fibromyalgia: Secondary | ICD-10-CM | POA: Diagnosis present

## 2018-05-11 DIAGNOSIS — F410 Panic disorder [episodic paroxysmal anxiety] without agoraphobia: Secondary | ICD-10-CM | POA: Diagnosis present

## 2018-05-11 DIAGNOSIS — R531 Weakness: Secondary | ICD-10-CM | POA: Diagnosis present

## 2018-05-11 DIAGNOSIS — R112 Nausea with vomiting, unspecified: Secondary | ICD-10-CM | POA: Diagnosis not present

## 2018-05-11 DIAGNOSIS — E119 Type 2 diabetes mellitus without complications: Secondary | ICD-10-CM | POA: Diagnosis present

## 2018-05-11 DIAGNOSIS — Z7982 Long term (current) use of aspirin: Secondary | ICD-10-CM

## 2018-05-11 DIAGNOSIS — E039 Hypothyroidism, unspecified: Secondary | ICD-10-CM | POA: Diagnosis present

## 2018-05-11 DIAGNOSIS — K219 Gastro-esophageal reflux disease without esophagitis: Secondary | ICD-10-CM | POA: Diagnosis present

## 2018-05-11 DIAGNOSIS — Z79890 Hormone replacement therapy (postmenopausal): Secondary | ICD-10-CM | POA: Diagnosis not present

## 2018-05-11 DIAGNOSIS — E871 Hypo-osmolality and hyponatremia: Secondary | ICD-10-CM | POA: Diagnosis not present

## 2018-05-11 DIAGNOSIS — G9341 Metabolic encephalopathy: Secondary | ICD-10-CM | POA: Diagnosis present

## 2018-05-11 DIAGNOSIS — E785 Hyperlipidemia, unspecified: Secondary | ICD-10-CM | POA: Diagnosis present

## 2018-05-11 HISTORY — DX: Fibromyalgia: M79.7

## 2018-05-11 HISTORY — DX: Type 2 diabetes mellitus without complications: E11.9

## 2018-05-11 HISTORY — DX: Diaphragmatic hernia without obstruction or gangrene: K44.9

## 2018-05-11 HISTORY — DX: Essential (primary) hypertension: I10

## 2018-05-11 HISTORY — DX: Anemia, unspecified: D64.9

## 2018-05-11 HISTORY — DX: Gastro-esophageal reflux disease without esophagitis: K21.9

## 2018-05-11 HISTORY — DX: Panic disorder (episodic paroxysmal anxiety): F41.0

## 2018-05-11 HISTORY — DX: Anxiety disorder, unspecified: F41.9

## 2018-05-11 LAB — BASIC METABOLIC PANEL
ANION GAP: 8 (ref 5–15)
Anion gap: 10 (ref 5–15)
BUN: 13 mg/dL (ref 8–23)
BUN: 13 mg/dL (ref 8–23)
CHLORIDE: 75 mmol/L — AB (ref 98–111)
CHLORIDE: 79 mmol/L — AB (ref 98–111)
CO2: 24 mmol/L (ref 22–32)
CO2: 23 mmol/L (ref 22–32)
CREATININE: 0.9 mg/dL (ref 0.44–1.00)
CREATININE: 1 mg/dL (ref 0.44–1.00)
Calcium: 8.3 mg/dL — ABNORMAL LOW (ref 8.9–10.3)
Calcium: 8.1 mg/dL — ABNORMAL LOW (ref 8.9–10.3)
GFR calc non Af Amer: 60 mL/min — ABNORMAL LOW (ref >60)
GFR calc non Af Amer: 53 mL/min — ABNORMAL LOW (ref >60)
Glucose, Bld: 89 mg/dL (ref 70–99)
Glucose, Bld: 111 mg/dL — ABNORMAL HIGH (ref 70–99)
POTASSIUM: 3.1 mmol/L — AB (ref 3.5–5.1)
POTASSIUM: 3.2 mmol/L — AB (ref 3.5–5.1)
Sodium: 109 mmol/L — CL (ref 135–145)
Sodium: 110 mmol/L — CL (ref 135–145)

## 2018-05-11 LAB — COMPREHENSIVE METABOLIC PANEL
ALT: 24 U/L (ref 0–44)
ANION GAP: 11 (ref 5–15)
AST: 28 U/L (ref 15–41)
Albumin: 4.1 g/dL (ref 3.5–5.0)
Alkaline Phosphatase: 73 U/L (ref 38–126)
BILIRUBIN TOTAL: 1.1 mg/dL (ref 0.3–1.2)
BUN: 14 mg/dL (ref 8–23)
CO2: 23 mmol/L (ref 22–32)
Calcium: 8.8 mg/dL — ABNORMAL LOW (ref 8.9–10.3)
Chloride: 75 mmol/L — ABNORMAL LOW (ref 98–111)
Creatinine, Ser: 0.94 mg/dL (ref 0.44–1.00)
GFR, EST NON AFRICAN AMERICAN: 57 mL/min — AB (ref >60)
Glucose, Bld: 115 mg/dL — ABNORMAL HIGH (ref 70–99)
Potassium: 3.1 mmol/L — ABNORMAL LOW (ref 3.5–5.1)
Sodium: 109 mmol/L — CL (ref 135–145)
TOTAL PROTEIN: 7.1 g/dL (ref 6.5–8.1)

## 2018-05-11 LAB — URINALYSIS, ROUTINE W REFLEX MICROSCOPIC
BILIRUBIN URINE: NEGATIVE
Glucose, UA: NEGATIVE mg/dL
Hgb urine dipstick: NEGATIVE
KETONES UR: NEGATIVE mg/dL
Leukocytes, UA: NEGATIVE
NITRITE: NEGATIVE
Protein, ur: NEGATIVE mg/dL
Specific Gravity, Urine: 1.01 (ref 1.005–1.030)
pH: 8 (ref 5.0–8.0)

## 2018-05-11 LAB — CBC WITH DIFFERENTIAL/PLATELET
BASOS PCT: 0 %
Basophils Absolute: 0 10*3/uL (ref 0.0–0.1)
EOS ABS: 0 10*3/uL (ref 0.0–0.7)
Eosinophils Relative: 0 %
HCT: 32 % — ABNORMAL LOW (ref 36.0–46.0)
Hemoglobin: 11.8 g/dL — ABNORMAL LOW (ref 12.0–15.0)
Lymphocytes Relative: 14 %
Lymphs Abs: 1.7 10*3/uL (ref 0.7–4.0)
MCH: 30.1 pg (ref 26.0–34.0)
MCHC: 36.9 g/dL — AB (ref 30.0–36.0)
MCV: 81.6 fL (ref 78.0–100.0)
MONO ABS: 1.2 10*3/uL (ref 0.1–1.0)
Monocytes Relative: 10 %
NEUTROS PCT: 76 %
Neutro Abs: 8.9 10*3/uL (ref 1.7–7.7)
Platelets: 269 10*3/uL (ref 150–400)
RBC: 3.92 MIL/uL (ref 3.87–5.11)
RDW: 12 % (ref 11.5–15.5)
WBC: 11.8 10*3/uL — AB (ref 4.0–10.5)

## 2018-05-11 LAB — SODIUM, URINE, RANDOM: SODIUM UR: 118 mmol/L

## 2018-05-11 LAB — MRSA PCR SCREENING: MRSA BY PCR: NEGATIVE

## 2018-05-11 LAB — AMMONIA

## 2018-05-11 LAB — OSMOLALITY, URINE: OSMOLALITY UR: 428 mosm/kg (ref 300–900)

## 2018-05-11 LAB — HEMOGLOBIN A1C
HEMOGLOBIN A1C: 6.3 % — AB (ref 4.8–5.6)
Mean Plasma Glucose: 134.11 mg/dL

## 2018-05-11 LAB — OSMOLALITY: OSMOLALITY: 229 mosm/kg — AB (ref 275–295)

## 2018-05-11 LAB — LIPASE, BLOOD: LIPASE: 29 U/L (ref 11–51)

## 2018-05-11 LAB — CREATININE, URINE, RANDOM: CREATININE, URINE: 39.99 mg/dL

## 2018-05-11 LAB — TROPONIN I: Troponin I: <0.03 ng/mL (ref <0.03)

## 2018-05-11 LAB — MAGNESIUM: MAGNESIUM: 1.8 mg/dL (ref 1.7–2.4)

## 2018-05-11 LAB — TSH: TSH: 1.295 u[IU]/mL (ref 0.350–4.500)

## 2018-05-11 MED ORDER — ASPIRIN EC 81 MG PO TBEC
81.0000 mg | DELAYED_RELEASE_TABLET | Freq: Every day | ORAL | Status: DC
  Administered 2018-05-11 – 2018-05-15 (×5): 81 mg via ORAL
  Filled 2018-05-11 (×5): qty 1

## 2018-05-11 MED ORDER — METOPROLOL SUCCINATE ER 50 MG PO TB24
50.0000 mg | ORAL_TABLET | Freq: Every day | ORAL | Status: DC
  Administered 2018-05-11 – 2018-05-12 (×2): 50 mg via ORAL
  Filled 2018-05-11 (×2): qty 1

## 2018-05-11 MED ORDER — ACETAMINOPHEN 325 MG PO TABS
650.0000 mg | ORAL_TABLET | Freq: Four times a day (QID) | ORAL | Status: DC | PRN
  Administered 2018-05-11: 650 mg via ORAL
  Filled 2018-05-11: qty 2

## 2018-05-11 MED ORDER — POTASSIUM CHLORIDE CRYS ER 20 MEQ PO TBCR
40.0000 meq | EXTENDED_RELEASE_TABLET | Freq: Once | ORAL | Status: AC
  Administered 2018-05-11: 40 meq via ORAL
  Filled 2018-05-11: qty 2

## 2018-05-11 MED ORDER — LORAZEPAM 0.5 MG PO TABS
0.5000 mg | ORAL_TABLET | Freq: Once | ORAL | Status: AC
  Administered 2018-05-11: 0.5 mg via ORAL
  Filled 2018-05-11: qty 1

## 2018-05-11 MED ORDER — POTASSIUM CHLORIDE CRYS ER 20 MEQ PO TBCR
20.0000 meq | EXTENDED_RELEASE_TABLET | Freq: Once | ORAL | Status: AC
  Administered 2018-05-11: 20 meq via ORAL
  Filled 2018-05-11: qty 1

## 2018-05-11 MED ORDER — VITAMIN D 1000 UNITS PO TABS
2000.0000 [IU] | ORAL_TABLET | Freq: Every day | ORAL | Status: DC
  Administered 2018-05-11 – 2018-05-15 (×5): 2000 [IU] via ORAL
  Filled 2018-05-11 (×5): qty 2

## 2018-05-11 MED ORDER — LEVOTHYROXINE SODIUM 100 MCG PO TABS
100.0000 ug | ORAL_TABLET | Freq: Every day | ORAL | Status: DC
  Administered 2018-05-12 – 2018-05-15 (×4): 100 ug via ORAL
  Filled 2018-05-11 (×3): qty 1

## 2018-05-11 MED ORDER — ACETAMINOPHEN 650 MG RE SUPP: 650.0000 mg | Freq: Four times a day (QID) | RECTAL | Status: DC | PRN

## 2018-05-11 MED ORDER — ONDANSETRON HCL 4 MG PO TABS
4.0000 mg | ORAL_TABLET | Freq: Four times a day (QID) | ORAL | Status: DC | PRN
Start: 2018-05-11 — End: 2018-05-15
  Administered 2018-05-11: 4 mg via ORAL
  Filled 2018-05-11: qty 1

## 2018-05-11 MED ORDER — ONDANSETRON HCL 4 MG/2ML IJ SOLN: 4.0000 mg | Freq: Four times a day (QID) | INTRAMUSCULAR | Status: DC | PRN

## 2018-05-11 MED ORDER — SODIUM CHLORIDE 3 % IV SOLN
INTRAVENOUS | Status: DC
  Administered 2018-05-11: 30 mL/h via INTRAVENOUS
  Filled 2018-05-11 (×2): qty 500

## 2018-05-11 MED ORDER — SODIUM CHLORIDE 0.9 % IV SOLN: 1000.0000 mL | INTRAVENOUS | Status: DC

## 2018-05-11 MED ORDER — ENOXAPARIN SODIUM 40 MG/0.4ML ~~LOC~~ SOLN
40.0000 mg | SUBCUTANEOUS | Status: DC
  Administered 2018-05-11 – 2018-05-15 (×5): 40 mg via SUBCUTANEOUS
  Filled 2018-05-11 (×5): qty 0.4

## 2018-05-11 MED ORDER — SODIUM CHLORIDE 0.9 % IV BOLUS (SEPSIS): 500.0000 mL | Freq: Once | INTRAVENOUS | Status: DC

## 2018-05-11 MED ORDER — SIMVASTATIN 20 MG PO TABS
10.0000 mg | ORAL_TABLET | Freq: Every day | ORAL | Status: DC
  Administered 2018-05-11 – 2018-05-15 (×5): 10 mg via ORAL
  Filled 2018-05-11 (×5): qty 1

## 2018-05-11 MED ORDER — SODIUM CHLORIDE 0.9 % IV SOLN
INTRAVENOUS | Status: DC
  Administered 2018-05-11: 11:00:00 via INTRAVENOUS

## 2018-05-11 MED ORDER — LORATADINE 10 MG PO TABS
10.0000 mg | ORAL_TABLET | Freq: Every day | ORAL | Status: DC
  Administered 2018-05-11 – 2018-05-15 (×5): 10 mg via ORAL
  Filled 2018-05-11 (×5): qty 1

## 2018-05-11 MED ORDER — PANTOPRAZOLE SODIUM 40 MG PO TBEC
40.0000 mg | DELAYED_RELEASE_TABLET | Freq: Every day | ORAL | Status: DC
  Administered 2018-05-11 – 2018-05-15 (×5): 40 mg via ORAL
  Filled 2018-05-11 (×5): qty 1

## 2018-05-11 MED ORDER — ONDANSETRON HCL 4 MG PO TABS
4.0000 mg | ORAL_TABLET | Freq: Once | ORAL | Status: AC
  Administered 2018-05-11: 4 mg via ORAL
  Filled 2018-05-11: qty 1

## 2018-05-11 NOTE — Progress Notes (Signed)
CRITICAL VALUE ALERT  Critical Value:  Serum osmolarity Date & Time Notied:  05/11/18 Provider Notified: Dr. Carles Collet  Orders Received/Actions taken:

## 2018-05-11 NOTE — H&P (Signed)
History and Physical  Nicole Shaw LKG:[id number] DOB: 1940/12/08 DOA: 05/11/2018   PCP: Rusty Aus, MD   Patient coming from: Home  Chief Complaint: generalized weakness  HPI:  Nicole Shaw is a 77 y.o. female with medical history of anxiety, fibromyalgia, hypertension, diabetes mellitus presenting with generalized weakness, dizziness for approximately 1 week.  The patient also complained of intermittent nausea and vomiting for approximately the past 2 days.  She denies any fevers, chills, chest pain, shortness breath, abdominal pain but she complains of some dysuria.  She denies any headache, neck pain.  She denies any new over-the-counter medications.  She states that she saw her primary care provider on 05/10/2018.  At that time, the patient's diltiazem and Lexapro were discontinued.  She was instructed to decrease her Micardis HCTZ to half a tablet daily.  She was started on metoprolol succinate.  She denies any other new medications.  The patient denies drinking excessive amounts of water.  She endorses compliance with all her mother medications.  She states that she normally has intermittent nausea and vomiting that has been chronic secondary to her hiatus hernia, but feels that it has been more frequent in the past 2 days.  There is no hematemesis or melena.  Because of her generalized weakness, dizziness, and confusion, the patient presented to the emergency department.  In the emergency department, the patient was afebrile hemodynamically stable saturating 100% on room air.  Patient was noted to have sodium 109.  LFTs were unremarkable.  WBC was 11.8.  Assessment/Plan: Hyponatremia -Likely multifactorial including decreased solute intake, vomiting, and medications -Start 3% hypertonic saline at 30 cc/h -BMP every 4 hours -Urine osmolarity -Serum osmolarity -Urine sodium -Urine creatinine -Continue to hold Lexapro and Micardis HCTZ -Check TSH -Check a.m. cortisol  Acute  metabolic encephalopathy -Secondary to hyponatremia -Check UA and urine culture  Hypokalemia -Replete -Check magnesium  Essential hypertension -Continue metoprolol succinate and adjust based upon the patient's blood pressure -Holding Micardis HCTZ secondary to hyponatremia  Diabetes mellitus type 2 -Appears to be diet-controlled as the patient is not on any agents -Hemoglobin A1c  Hypothyroidism -Continue Synthroid -Check TSH  Intractable nausea and vomiting -May be in part due to the patient's severe hyponatremia -Clear liquid diet -Continue PPI -Lipase normal; LFTs normal -As needed antiemetics  Hyperlipidemia -Continue statin        Past Medical History:  Diagnosis Date  . Anemia   . Anxiety   . Diabetes mellitus without complication ([place])   . Fibromyalgia   . GERD (gastroesophageal reflux disease)   . Hiatal hernia   . Hypertension   . Panic attacks    History reviewed. No pertinent surgical history. Social History:  reports that she has never smoked. She does not have any smokeless tobacco history on file. She reports that she does not drink alcohol or use drugs.   Family History  Problem Relation Age of Onset  . Breast cancer Sister 67  . Breast cancer Maternal Aunt      Allergies  Allergen Reactions  . Ace Inhibitors Other (See Comments)    cough  . Penicillins   . Sulfa Antibiotics   . Amlodipine Palpitations     Prior to Admission medications   Medication Sig Start Date End Date Taking? Authorizing Provider  aspirin EC 81 MG tablet Take 1 tablet by mouth daily.   Yes [provider]  Cholecalciferol (VITAMIN D3) 2000 units capsule Take 1 capsule by  mouth daily.   Yes [provider]  diltiazem (CARDIZEM CD) 180 MG 24 hr capsule Take 1 capsule by mouth daily. 05/03/18  Yes [provider]  escitalopram (LEXAPRO) 10 MG tablet Take 1 tablet by mouth daily. 04/19/18 07/18/18 Yes [provider]    esomeprazole (NEXIUM) 20 MG capsule Take 1-2 capsules by mouth daily at 12 noon. 2 capsules in morning and 1 at night 04/19/18  Yes [provider]  levothyroxine (SYNTHROID, LEVOTHROID) 100 MCG tablet Take 1 tablet by mouth daily. 04/19/18  Yes [provider]  loratadine (CLARITIN) 10 MG tablet Take 1 tablet by mouth daily.   Yes [provider]  metoprolol succinate (TOPROL-XL) 50 MG 24 hr tablet Take 1 tablet by mouth daily. 05/09/18 05/09/19 Yes [provider]  ondansetron (ZOFRAN) 4 MG tablet Take 1 tablet by mouth 3 (three) times daily as needed. 05/10/18  Yes [provider]  Probiotic Product ([place]) CAPS Take 1 capsule by mouth daily.   Yes [provider]  simvastatin (ZOCOR) 10 MG tablet Take 1 tablet by mouth daily. 04/19/18  Yes [provider]  telmisartan-hydrochlorothiazide (MICARDIS HCT) 80-12.5 MG tablet Take 0.5 tablets by mouth 2 (two) times daily.  04/19/18  Yes [provider]    Review of Systems:  Constitutional:  No weight loss, night sweats, Fevers, chills, fatigue.  Head&Eyes: No headache.  No vision loss.  No eye pain or scotoma ENT:  No Difficulty swallowing,Tooth/dental problems,Sore throat,  No ear ache, post nasal drip,  Cardio-vascular:  No chest pain, Orthopnea, PND, swelling in lower extremities,  dizziness, palpitations  GI:  No  abdominal pain, nausea, vomiting, diarrhea, loss of appetite, hematochezia, melena, heartburn, indigestion, Resp:  No shortness of breath with exertion or at rest. No cough. No coughing up of blood .No wheezing.No chest wall deformity  Skin:  no rash or lesions.  GU:  no dysuria, change in color of urine, no urgency or frequency. No flank pain.  Musculoskeletal:  No joint pain or swelling. No decreased range of motion. No back pain.  Psych:  No change in mood or affect. No depression or anxiety. Neurologic: No headache, no dysesthesia, no  focal weakness, no vision loss. No syncope  Physical Exam: Vitals:   05/11/18 1115 05/11/18 1130 05/11/18 1145 05/11/18 1200  BP:  (!) 167/63  (!) 149/69  Pulse: 62 64 63 64  Resp:      Temp:      TempSrc:      SpO2: 96% 100% 100% 99%  Weight:       General:  A&O x 3, NAD, nontoxic, pleasant/cooperative Head/Eye: No conjunctival hemorrhage, no icterus, [place]/AT, No nystagmus ENT:  No icterus,  No thrush, good dentition, no pharyngeal exudate Neck:  No masses, no lymphadenpathy, no bruits CV:  RRR, no rub, no gallop, no S3 Lung:  CTAB, good air movement, no wheeze, no rhonchi Abdomen: soft/NT, +BS, nondistended, no peritoneal signs Ext: No cyanosis, No rashes, No petechiae, No lymphangitis, No edema Neuro: CNII-XII intact, strength 4/5 in bilateral upper and lower extremities, no dysmetria  Labs on Admission:  Basic Metabolic Panel: Recent Labs  Lab 05/11/18 1009  NA 109*  K 3.1*  CL 75*  CO2 23  GLUCOSE 115*  BUN 14  CREATININE 0.94  CALCIUM 8.8*   Liver Function Tests: Recent Labs  Lab 05/11/18 1009  AST 28  ALT 24  ALKPHOS 73  BILITOT 1.1  PROT 7.1  ALBUMIN 4.1  Recent Labs  Lab 05/11/18 1009  LIPASE 29   Recent Labs  Lab 05/11/18 1009  AMMONIA <9*   CBC: Recent Labs  Lab 05/11/18 1009  WBC 11.8*  NEUTROABS 8.9  HGB 11.8*  HCT 32.0*  MCV 81.6  PLT 269   Coagulation Profile: No results for input(s): INR, PROTIME in the last 168 hours. Cardiac Enzymes: Recent Labs  Lab 05/11/18 1009  TROPONINI <0.03   BNP: Invalid input(s): POCBNP CBG: No results for input(s): GLUCAP in the last 168 hours. Urine analysis:    Component Value Date/Time   COLORURINE STRAW (A) 03/31/2018 0912   APPEARANCEUR CLEAR (A) 03/31/2018 0912   APPEARANCEUR Clear 03/20/2014 0904   LABSPEC 1.009 03/31/2018 0912   LABSPEC 1.011 03/20/2014 0904   PHURINE 6.0 03/31/2018 0912   GLUCOSEU NEGATIVE 03/31/2018 0912   GLUCOSEU Negative 03/20/2014 0904   HGBUR NEGATIVE  03/31/2018 0912   BILIRUBINUR NEGATIVE 03/31/2018 0912   BILIRUBINUR Negative 03/20/2014 0904   KETONESUR NEGATIVE 03/31/2018 0912   PROTEINUR NEGATIVE 03/31/2018 0912   NITRITE NEGATIVE 03/31/2018 0912   LEUKOCYTESUR NEGATIVE 03/31/2018 0912   LEUKOCYTESUR Negative 03/20/2014 0904   Sepsis Labs: @LABRCNTIP (procalcitonin:4,lacticidven:4) )No results found for this or any previous visit (from the past 240 hour(s)).   Radiological Exams on Admission: No results found.      Time spent:60 minutes Code Status:  FULL Family Communication:  Cousin updated at bedside Disposition Plan: expect 2-3 day hospitalization Consults called: none DVT Prophylaxis: [place] Lovenox  Orson Eva, DO  [facility] Pager [phone]  If 7PM-7AM, please contact night-coverage [email or web address] Password [facility] 05/11/2018, 12:17 PM

## 2018-05-11 NOTE — ED Notes (Signed)
CRITICAL VALUE ALERT  Critical Value:  Na 109  Date & Time Notied:  6962 05/11/18  Provider Notified: dr Meryl Crutch  Orders Received/Actions taken: none at this time

## 2018-05-11 NOTE — ED Triage Notes (Signed)
Pt brought in by EMS due to high blood pressure and weakness x one week. EMS bp 180/70. Pt has had changes in BP meds. Seen By dr Emily Filbert Pt unsure of what meds where changed

## 2018-05-11 NOTE — ED Provider Notes (Signed)
[facility] EMERGENCY DEPARTMENT Provider Note   CSN: [id number] Arrival date & time: 05/11/18  5056     History   Chief Complaint Chief Complaint  Patient presents with  . Hypertension    HPI Nicole Shaw is a 77 y.o. female.  Patient is a 77 year old female who presents to the emergency department with a complaint of high blood pressure.  Patient has a history of nausea, blurred vision, back pain, and difficulty controlling her blood pressure over the past couple of weeks.  She has been seen at the [facility], her medications have been adjusted, but the patient states she still has problems with some nausea from time to time she continues to have back pain, and she also has a problem keeping her blood pressure under control.  It is of note that the patient also has diet-controlled type 2 diabetes and fibromyalgia.  The patient denies changes in her swallowing or speech.  She has function of her upper and lower extremities.  There is been no loss of bowel or bladder functions.  She has not had recent falls.  She has had occasional mild headache, but otherwise no other changes to be reported.  Patient has had some problems with anxiety and depression, but has not required formal treatment and therapy.  She is also been treated for reflux recently and currently uses over-the-counter Nexium for that.  She presents now for evaluation and assistance with these issues.     Past Medical History:  Diagnosis Date  . Anemia   . Anxiety   . Diabetes mellitus without complication ([place])   . Fibromyalgia   . GERD (gastroesophageal reflux disease)   . Hiatal hernia   . Hypertension   . Panic attacks     There are no active problems to display for this patient.   History reviewed. No pertinent surgical history.   OB History   None      Home Medications    Prior to Admission medications   Medication Sig Start Date End Date Taking? Authorizing Provider  aspirin EC 81 MG  tablet Take 1 tablet by mouth daily.   Yes [provider]  Cholecalciferol (VITAMIN D3) 2000 units capsule Take 1 capsule by mouth daily.   Yes [provider]  diltiazem (CARDIZEM CD) 180 MG 24 hr capsule Take 1 capsule by mouth daily. 05/03/18  Yes [provider]  escitalopram (LEXAPRO) 10 MG tablet Take 1 tablet by mouth daily. 04/19/18 07/18/18 Yes [provider]  esomeprazole (NEXIUM) 20 MG capsule Take 1-2 capsules by mouth daily at 12 noon. 2 capsules in morning and 1 at night 04/19/18  Yes [provider]  levothyroxine (SYNTHROID, LEVOTHROID) 100 MCG tablet Take 1 tablet by mouth daily. 04/19/18  Yes [provider]  loratadine (CLARITIN) 10 MG tablet Take 1 tablet by mouth daily.   Yes [provider]  metoprolol succinate (TOPROL-XL) 50 MG 24 hr tablet Take 1 tablet by mouth daily. 05/09/18 05/09/19 Yes [provider]  ondansetron (ZOFRAN) 4 MG tablet Take 1 tablet by mouth 3 (three) times daily as needed. 05/10/18  Yes [provider]  Probiotic Product ([place]) CAPS Take 1 capsule by mouth daily.   Yes [provider]  simvastatin (ZOCOR) 10 MG tablet Take 1 tablet by mouth daily. 04/19/18  Yes [provider]  telmisartan-hydrochlorothiazide (MICARDIS HCT) 80-12.5 MG tablet Take 0.5 tablets by mouth 2 (two) times daily.  04/19/18  Yes [provider]    Family History Family History  Problem Relation Age of Onset  . Breast cancer Sister 61  . Breast cancer Maternal Aunt     Social History Social History   Tobacco Use  . Smoking status: Never Smoker  Substance Use Topics  . Alcohol use: Never    Frequency: Never  . Drug use: Never     Allergies   Ace inhibitors; Penicillins; Sulfa antibiotics; and Amlodipine   Review of Systems Review of Systems  Constitutional: Positive for activity change, appetite change and fatigue.       All ROS Neg except as  noted in HPI  HENT: Negative for nosebleeds.   Eyes: Negative for photophobia and discharge.  Respiratory: Negative for cough, shortness of breath and wheezing.   Cardiovascular: Negative for chest pain and palpitations.  Gastrointestinal: Positive for nausea. Negative for abdominal pain and blood in stool.  Genitourinary: Negative for dysuria, frequency and hematuria.  Musculoskeletal: Positive for back pain. Negative for arthralgias and neck pain.  Skin: Negative.   Neurological: Positive for weakness. Negative for dizziness, seizures and speech difficulty.  Psychiatric/Behavioral: Negative for confusion and hallucinations.     Physical Exam Updated Vital Signs BP (!) 167/82 (BP Location: Left Arm)   Pulse 64   Temp 98 F (36.7 C) (Oral)   Resp 16   Wt 78.9 kg (174 lb)   SpO2 100%   Physical Exam  Constitutional: She is oriented to person, place, and time. She appears well-developed and well-nourished.  Non-toxic appearance.  HENT:  Head: Normocephalic.  Right Ear: Tympanic membrane and external ear normal.  Left Ear: Tympanic membrane and external ear normal.  Eyes: Pupils are equal, round, and reactive to light. EOM and lids are normal.  Neck: Normal range of motion. Neck supple. Carotid bruit is not present.  Cardiovascular: Normal rate, regular rhythm, normal heart sounds, intact distal pulses and normal pulses.  Pulmonary/Chest: Breath sounds normal. No respiratory distress.  Abdominal: Soft. Bowel sounds are normal. There is no tenderness. There is no guarding.  No mass or pulsating mass appreciated.  Patient has good bowel sounds.  Musculoskeletal: Normal range of motion. She exhibits tenderness.       Lumbar back: She exhibits tenderness.  Capillary refill is less than 3 seconds.  Radial pulses are 2+ bilaterally dorsalis pedis pulses are 2+ bilaterally.  Lymphadenopathy:       Head (right side): No submandibular adenopathy present.       Head (left side): No  submandibular adenopathy present.    She has no cervical adenopathy.  Neurological: She is alert and oriented to person, place, and time. She has normal strength. No cranial nerve deficit or sensory deficit.  Skin: Skin is warm and dry.  Psychiatric: She has a normal mood and affect. Her speech is normal.  Nursing note and vitals reviewed.    ED Treatments / Results  Labs (all labs ordered are listed, but only abnormal results are displayed) Labs Reviewed  COMPREHENSIVE METABOLIC PANEL - Abnormal; Notable for the following components:      Result Value   Sodium 109 (*)    Potassium 3.1 (*)    Chloride 75 (*)    Glucose, Bld 115 (*)    Calcium 8.8 (*)    GFR calc non Af Amer 57 (*)    All other components within normal limits  CBC WITH DIFFERENTIAL/PLATELET - Abnormal; Notable for the following components:   WBC 11.8 (*)    Hemoglobin  11.8 (*)    HCT 32.0 (*)    MCHC 36.9 (*)    All other components within normal limits  AMMONIA - Abnormal; Notable for the following components:   Ammonia <9 (*)    All other components within normal limits  LIPASE, BLOOD  TROPONIN I  URINALYSIS, ROUTINE W REFLEX MICROSCOPIC    EKG None  Radiology No results found.  Procedures Procedures (including critical care time)  CRITICAL CARE Performed by: Lily Kocher Total critical care time: *35** minutes Critical care time was exclusive of separately billable procedures and treating other patients. Critical care was necessary to treat or prevent imminent or life-threatening deterioration. Critical care was time spent personally by me on the following activities: development of treatment plan with patient and/or surrogate as well as nursing, discussions with consultants, evaluation of patient's response to treatment, examination of patient, obtaining history from patient or surrogate, ordering and performing treatments and interventions, ordering and review of laboratory studies, ordering and  review of radiographic studies, pulse oximetry and re-evaluation of patient's condition. Medications Ordered in ED Medications  0.9 %  sodium chloride infusion (has no administration in time range)  LORazepam (ATIVAN) tablet 0.5 mg (0.5 mg Oral Given 05/11/18 0959)  ondansetron (ZOFRAN) tablet 4 mg (4 mg Oral Given 05/11/18 0959)     Initial Impression / Assessment and Plan / ED Course  I have reviewed the triage vital signs and the nursing notes.  Pertinent labs & imaging results that were available during my care of the patient were reviewed by me and considered in my medical decision making (see chart for details).       Final Clinical Impressions(s) / ED Diagnoses MDM  Vital signs reviewed.  Blood pressure is slightly elevated, otherwise vital signs are within normal limits.  Pulse oximetry is 100% on room air.  Within normal limits by my interpretation.  Patient complaining of weakness, and having problems with control of blood pressure.  There is also been issues with nausea.  Will check a troponin to rule out silent myocardial event, will also check electrolytes.  We will check an ammonia level.  Patient seems to be somewhat anxious, will use Zofran for the nausea, will use small dose of Ativan to help with her comfort while pursuing the work-up.  The conference of metabolic panel shows a critical sodium of 109.  The potassium is low at 3.1, the chloride is low at 75.  The remainder of the conference of metabolic panel is essentially within normal limits. Case discussed with Dr Thurnell Garbe  Update: Case discussed with pt and family.  IV normal saline started.  Oral potassium will be given to the patient. Lipase is 29, within normal limits.  Troponin is less than 0.03, within normal limits.  The complete blood count shows the white blood cells to be slightly elevated at 11.8, the hemoglobin slightly low at 11.8, the hematocrit also low at 32.  The platelets are within normal limits at  269,000.  There is no shift to the left.  Will discuss case with the hospitalist for hospital admission. Case discussed with the family patient in terms which they understand.  Patient is in agreement for hospital admission to correct the critical hyponatremia.   Final diagnoses:  Hyponatremia    ED Discharge Orders    None       Lily Kocher, PA-C 05/12/18 [place], [place], DO 05/14/18 1756

## 2018-05-12 ENCOUNTER — Other Ambulatory Visit: Payer: Self-pay

## 2018-05-12 ENCOUNTER — Encounter (HOSPITAL_COMMUNITY): Payer: Self-pay | Admitting: *Deleted

## 2018-05-12 LAB — BASIC METABOLIC PANEL
ANION GAP: 7 (ref 5–15)
ANION GAP: 7 (ref 5–15)
ANION GAP: 6 (ref 5–15)
ANION GAP: 6 (ref 5–15)
Anion gap: 8 (ref 5–15)
Anion gap: 7 (ref 5–15)
BUN: 14 mg/dL (ref 8–23)
BUN: 14 mg/dL (ref 8–23)
BUN: 14 mg/dL (ref 8–23)
BUN: 14 mg/dL (ref 8–23)
BUN: 13 mg/dL (ref 8–23)
BUN: 14 mg/dL (ref 8–23)
CALCIUM: 8.5 mg/dL — AB (ref 8.9–10.3)
CALCIUM: 8.6 mg/dL — AB (ref 8.9–10.3)
CALCIUM: 8.6 mg/dL — AB (ref 8.9–10.3)
CALCIUM: 8.3 mg/dL — AB (ref 8.9–10.3)
CHLORIDE: 84 mmol/L — AB (ref 98–111)
CHLORIDE: 89 mmol/L — AB (ref 98–111)
CHLORIDE: 92 mmol/L — AB (ref 98–111)
CO2: 24 mmol/L (ref 22–32)
CO2: 23 mmol/L (ref 22–32)
CO2: 24 mmol/L (ref 22–32)
CO2: 24 mmol/L (ref 22–32)
CO2: 23 mmol/L (ref 22–32)
CO2: 22 mmol/L (ref 22–32)
Calcium: 8.3 mg/dL — ABNORMAL LOW (ref 8.9–10.3)
Calcium: 8.2 mg/dL — ABNORMAL LOW (ref 8.9–10.3)
Chloride: 89 mmol/L — ABNORMAL LOW (ref 98–111)
Chloride: 89 mmol/L — ABNORMAL LOW (ref 98–111)
Chloride: 89 mmol/L — ABNORMAL LOW (ref 98–111)
Creatinine, Ser: 1.1 mg/dL — ABNORMAL HIGH (ref 0.44–1.00)
Creatinine, Ser: 1.06 mg/dL — ABNORMAL HIGH (ref 0.44–1.00)
Creatinine, Ser: 1.11 mg/dL — ABNORMAL HIGH (ref 0.44–1.00)
Creatinine, Ser: 1.07 mg/dL — ABNORMAL HIGH (ref 0.44–1.00)
Creatinine, Ser: 1.13 mg/dL — ABNORMAL HIGH (ref 0.44–1.00)
Creatinine, Ser: 1.17 mg/dL — ABNORMAL HIGH (ref 0.44–1.00)
GFR calc Af Amer: 54 mL/min — ABNORMAL LOW (ref >60)
GFR calc Af Amer: 57 mL/min — ABNORMAL LOW (ref >60)
GFR calc Af Amer: 53 mL/min — ABNORMAL LOW (ref >60)
GFR calc non Af Amer: 47 mL/min — ABNORMAL LOW (ref >60)
GFR calc non Af Amer: 43 mL/min — ABNORMAL LOW (ref >60)
GFR, EST AFRICAN AMERICAN: 54 mL/min — AB (ref >60)
GFR, EST AFRICAN AMERICAN: 56 mL/min — AB (ref >60)
GFR, EST AFRICAN AMERICAN: 50 mL/min — AB (ref >60)
GFR, EST NON AFRICAN AMERICAN: 49 mL/min — AB (ref >60)
GFR, EST NON AFRICAN AMERICAN: 46 mL/min — AB (ref >60)
GFR, EST NON AFRICAN AMERICAN: 48 mL/min — AB (ref >60)
GFR, EST NON AFRICAN AMERICAN: 45 mL/min — AB (ref >60)
GLUCOSE: 90 mg/dL (ref 70–99)
GLUCOSE: 96 mg/dL (ref 70–99)
Glucose, Bld: 83 mg/dL (ref 70–99)
Glucose, Bld: 83 mg/dL (ref 70–99)
Glucose, Bld: 88 mg/dL (ref 70–99)
Glucose, Bld: 119 mg/dL — ABNORMAL HIGH (ref 70–99)
POTASSIUM: 3.9 mmol/L (ref 3.5–5.1)
POTASSIUM: 3.6 mmol/L (ref 3.5–5.1)
Potassium: 3.9 mmol/L (ref 3.5–5.1)
Potassium: 3.9 mmol/L (ref 3.5–5.1)
Potassium: 3.6 mmol/L (ref 3.5–5.1)
Potassium: 3.6 mmol/L (ref 3.5–5.1)
SODIUM: 119 mmol/L — AB (ref 135–145)
SODIUM: 120 mmol/L — AB (ref 135–145)
SODIUM: 121 mmol/L — AB (ref 135–145)
SODIUM: 115 mmol/L — AB (ref 135–145)
SODIUM: 120 mmol/L — AB (ref 135–145)
Sodium: 118 mmol/L — CL (ref 135–145)

## 2018-05-12 LAB — CBC
HCT: 32.8 % — ABNORMAL LOW (ref 36.0–46.0)
HEMOGLOBIN: 11.7 g/dL — AB (ref 12.0–15.0)
MCH: 29.9 pg (ref 26.0–34.0)
MCHC: 35.7 g/dL (ref 30.0–36.0)
MCV: 83.9 fL (ref 78.0–100.0)
Platelets: 238 10*3/uL (ref 150–400)
RBC: 3.91 MIL/uL (ref 3.87–5.11)
RDW: 12.2 % (ref 11.5–15.5)
WBC: 10 10*3/uL (ref 4.0–10.5)

## 2018-05-12 LAB — CORTISOL-AM, BLOOD: CORTISOL - AM: 10.1 ug/dL (ref 6.7–22.6)

## 2018-05-12 MED ORDER — POTASSIUM CHLORIDE IN NACL 20-0.9 MEQ/L-% IV SOLN
INTRAVENOUS | Status: DC
  Administered 2018-05-12 – 2018-05-13 (×3): via INTRAVENOUS

## 2018-05-12 NOTE — Progress Notes (Signed)
PROGRESS NOTE  Nicole Shaw NIO:[id number] DOB: 1941-08-05 DOA: 05/11/2018 PCP: Rusty Aus, MD  Brief History:  77 y.o. female with medical history of anxiety, fibromyalgia, hypertension, diabetes mellitus presenting with generalized weakness, dizziness for approximately 1 week.  The patient also complained of intermittent nausea and vomiting for approximately the past 2 days.  She denies any fevers, chills, chest pain, shortness breath, abdominal pain but she complains of some dysuria.  She denies any headache, neck pain.  She denies any new over-the-counter medications.  She states that she saw her primary care provider on 05/10/2018.  At that time, the patient's diltiazem and Lexapro were discontinued.  She was instructed to decrease her Micardis HCTZ to half a tablet daily.  She was started on metoprolol succinate.  She denies any other new medications.  The patient denies drinking excessive amounts of water.  She endorses compliance with all her mother medications.   In the emergency department, the patient was afebrile hemodynamically stable saturating 100% on room air.  Patient was noted to have sodium 109.  LFTs were unremarkable.  WBC was 11.8. The patient was then started on hypertonic saline with BMP q 4 hours.  Assessment/Plan: Hyponatremia -Likely multifactorial including decreased solute intake, vomiting, medications, a degree of SIADH -Urine osmolarity-428 -Serum osmolarity-229 -FeNa= 2.25% -Continue to hold Lexapro and Micardis HCTZ -Check TSH--1.295 -Check a.m. Cortisol--pending -05/12/18 @0700 --stopped 3% saline and recheck BMP to prevent over rapid correction -continue BMP q 4 hours  Acute metabolic encephalopathy -Secondary to hyponatremia -Check UA--neg for pyuria -improving with correction of Na  Hypokalemia -Replete -Check magnesium--1.8  Essential hypertension -Continue metoprolol succinate and adjust based upon the patient's blood  pressure -Holding Micardis HCTZ secondary to hyponatremia  Diabetes mellitus type 2 -Appears to be diet-controlled as the patient is not on any agents -Hemoglobin A1c--6.3  Hypothyroidism -Continue Synthroid -Check TSH--1.295  Intractable nausea and vomiting -May be in part due to the patient's severe hyponatremia -improving but remains nauseous -Continue PPI -Lipase normal; LFTs normal -As needed antiemetics -advance to full liquids  Hyperlipidemia -Continue statin    Disposition Plan:   Home in 1-2 days  Family Communication:   Family at bedside  Consultants:    Code Status:  FULL / DNR  DVT Prophylaxis:  [place] Heparin / [place] Lovenox   Procedures: As Listed in Progress Note Above  Antibiotics: None    Subjective: Pt is feeling better overall.  Vomiting is better but still feeling nausea.  No cp, sob, HA, visual disturbance of focal weakness.  No abd pain or dysuria.    Objective: Vitals:   05/11/18 2200 05/11/18 2300 05/12/18 0400 05/12/18 0500  BP: (!) 105/54 129/60    Pulse: (!) 53 (!) 53    Resp: 10 12    Temp:  97.6 F (36.4 C) 97.9 F (36.6 C)   TempSrc:  Oral Oral   SpO2: 93% 98%    Weight:    71.5 kg (157 lb 10.1 oz)  Height:        Intake/Output Summary (Last 24 hours) at 05/12/2018 0708 Last data filed at 05/12/2018 0214 Gross per 24 hour  Intake -  Output 751 ml  Net -751 ml   Weight change:  Exam:   General:  Pt is alert, follows commands appropriately, not in acute distress  HEENT: No icterus, No thrush, No neck mass, [place]/AT  Cardiovascular: RRR, S1/S2, no rubs, no gallops  Respiratory: CTA  bilaterally, no wheezing, no crackles, no rhonchi  Abdomen: Soft/+BS, non tender, non distended, no guarding  Extremities: No edema, No lymphangitis, No petechiae, No rashes, no synovitis   Data Reviewed: I have personally reviewed following labs and imaging studies Basic Metabolic Panel: Recent Labs  Lab 05/11/18 1009  05/11/18 1542 05/11/18 2002 05/12/18 0003 05/12/18 0412  NA 109* 109* 110* 115* 118*  K 3.1* 3.1* 3.2* 3.9 3.6  CL 75* 75* 79* 84* 89*  CO2 23 24 23 24 23   GLUCOSE 115* 89 111* 83 83  BUN 14 13 13 14 14   CREATININE 0.94 0.90 1.00 1.10* 1.06*  CALCIUM 8.8* 8.3* 8.1* 8.3* 8.2*  MG  --  1.8  --   --   --    Liver Function Tests: Recent Labs  Lab 05/11/18 1009  AST 28  ALT 24  ALKPHOS 73  BILITOT 1.1  PROT 7.1  ALBUMIN 4.1   Recent Labs  Lab 05/11/18 1009  LIPASE 29   Recent Labs  Lab 05/11/18 1009  AMMONIA <9*   Coagulation Profile: No results for input(s): INR, PROTIME in the last 168 hours. CBC: Recent Labs  Lab 05/11/18 1009  WBC 11.8*  NEUTROABS 8.9  HGB 11.8*  HCT 32.0*  MCV 81.6  PLT 269   Cardiac Enzymes: Recent Labs  Lab 05/11/18 1009  TROPONINI <0.03   BNP: Invalid input(s): POCBNP CBG: No results for input(s): GLUCAP in the last 168 hours. HbA1C: Recent Labs    05/11/18 1542  HGBA1C 6.3*   Urine analysis:    Component Value Date/Time   COLORURINE STRAW (A) 05/11/2018 0950   APPEARANCEUR CLEAR 05/11/2018 0950   APPEARANCEUR Clear 03/20/2014 0904   LABSPEC 1.010 05/11/2018 0950   LABSPEC 1.011 03/20/2014 0904   PHURINE 8.0 05/11/2018 0950   GLUCOSEU NEGATIVE 05/11/2018 0950   GLUCOSEU Negative 03/20/2014 0904   HGBUR NEGATIVE 05/11/2018 0950   BILIRUBINUR NEGATIVE 05/11/2018 0950   BILIRUBINUR Negative 03/20/2014 0904   KETONESUR NEGATIVE 05/11/2018 0950   PROTEINUR NEGATIVE 05/11/2018 0950   NITRITE NEGATIVE 05/11/2018 0950   LEUKOCYTESUR NEGATIVE 05/11/2018 0950   LEUKOCYTESUR Negative 03/20/2014 0904   Sepsis Labs: @LABRCNTIP (procalcitonin:4,lacticidven:4) ) Recent Results (from the past 240 hour(s))  MRSA PCR Screening     Status: None   Collection Time: 05/11/18 12:43 PM  Result Value Ref Range Status   MRSA by PCR NEGATIVE NEGATIVE Final    Comment:        The GeneXpert MRSA Assay (FDA approved for NASAL  specimens only), is one component of a comprehensive MRSA colonization surveillance program. It is not intended to diagnose MRSA infection nor to guide or monitor treatment for MRSA infections. Performed at [facility], [address], [place], [place] [address]      Scheduled Meds: . aspirin EC  81 mg Oral Daily  . cholecalciferol  2,000 Units Oral Daily  . enoxaparin (LOVENOX) injection  40 mg Subcutaneous Q24H  . levothyroxine  100 mcg Oral QAC breakfast  . loratadine  10 mg Oral Daily  . metoprolol succinate  50 mg Oral Daily  . pantoprazole  40 mg Oral Daily  . simvastatin  10 mg Oral Daily   Continuous Infusions:  Procedures/Studies: No results found.  Orson Eva, DO  [facility] Pager [phone]  If 7PM-7AM, please contact night-coverage [email or web address] Password [email or web address] 05/12/2018, 7:08 AM   LOS: 1 day

## 2018-05-12 NOTE — Care Management Important Message (Signed)
Important Message  Patient Details  Name: Nicole Shaw MRN: [id number] Date of Birth: Nov 15, 1940   Medicare Important Message Given:  Yes    Mychael Soots, Chauncey [place], RN 05/12/2018, 1:05 PM

## 2018-05-13 ENCOUNTER — Encounter (HOSPITAL_COMMUNITY): Payer: Self-pay

## 2018-05-13 LAB — BASIC METABOLIC PANEL
Anion gap: 7 (ref 5–15)
Anion gap: 6 (ref 5–15)
Anion gap: 5 (ref 5–15)
Anion gap: 6 (ref 5–15)
BUN: 11 mg/dL (ref 8–23)
BUN: 12 mg/dL (ref 8–23)
BUN: 13 mg/dL (ref 8–23)
BUN: 11 mg/dL (ref 8–23)
CHLORIDE: 90 mmol/L — AB (ref 98–111)
CHLORIDE: 96 mmol/L — AB (ref 98–111)
CO2: 22 mmol/L (ref 22–32)
CO2: 21 mmol/L — AB (ref 22–32)
CO2: 22 mmol/L (ref 22–32)
CO2: 21 mmol/L — ABNORMAL LOW (ref 22–32)
Calcium: 8.1 mg/dL — ABNORMAL LOW (ref 8.9–10.3)
Calcium: 8.4 mg/dL — ABNORMAL LOW (ref 8.9–10.3)
Calcium: 8.2 mg/dL — ABNORMAL LOW (ref 8.9–10.3)
Calcium: 8 mg/dL — ABNORMAL LOW (ref 8.9–10.3)
Chloride: 96 mmol/L — ABNORMAL LOW (ref 98–111)
Chloride: 97 mmol/L — ABNORMAL LOW (ref 98–111)
Creatinine, Ser: 1.11 mg/dL — ABNORMAL HIGH (ref 0.44–1.00)
Creatinine, Ser: 1.07 mg/dL — ABNORMAL HIGH (ref 0.44–1.00)
Creatinine, Ser: 1.06 mg/dL — ABNORMAL HIGH (ref 0.44–1.00)
Creatinine, Ser: 1.02 mg/dL — ABNORMAL HIGH (ref 0.44–1.00)
GFR calc Af Amer: 54 mL/min — ABNORMAL LOW (ref >60)
GFR calc Af Amer: 56 mL/min — ABNORMAL LOW (ref >60)
GFR calc non Af Amer: 46 mL/min — ABNORMAL LOW (ref >60)
GFR calc non Af Amer: 48 mL/min — ABNORMAL LOW (ref >60)
GFR calc non Af Amer: 49 mL/min — ABNORMAL LOW (ref >60)
GFR calc non Af Amer: 51 mL/min — ABNORMAL LOW (ref >60)
GFR, EST AFRICAN AMERICAN: 57 mL/min — AB (ref >60)
GFR, EST AFRICAN AMERICAN: 59 mL/min — AB (ref >60)
Glucose, Bld: 96 mg/dL (ref 70–99)
Glucose, Bld: 93 mg/dL (ref 70–99)
Glucose, Bld: 106 mg/dL — ABNORMAL HIGH (ref 70–99)
Glucose, Bld: 191 mg/dL — ABNORMAL HIGH (ref 70–99)
POTASSIUM: 3.8 mmol/L (ref 3.5–5.1)
POTASSIUM: 3.9 mmol/L (ref 3.5–5.1)
POTASSIUM: 3.7 mmol/L (ref 3.5–5.1)
POTASSIUM: 4 mmol/L (ref 3.5–5.1)
SODIUM: 123 mmol/L — AB (ref 135–145)
SODIUM: 124 mmol/L — AB (ref 135–145)
SODIUM: 118 mmol/L — AB (ref 135–145)
SODIUM: 124 mmol/L — AB (ref 135–145)

## 2018-05-13 LAB — URINE CULTURE: Culture: NO GROWTH

## 2018-05-13 MED ORDER — METOPROLOL SUCCINATE ER 25 MG PO TB24
25.0000 mg | ORAL_TABLET | Freq: Every day | ORAL | Status: DC
  Administered 2018-05-14 – 2018-05-15 (×2): 25 mg via ORAL
  Filled 2018-05-13 (×3): qty 1

## 2018-05-13 NOTE — Progress Notes (Signed)
CRITICAL VALUE ALERT  Critical Value:  Na 118  Date & Time Notied: 05/13/2018 0103

## 2018-05-13 NOTE — Progress Notes (Signed)
[facility] PROGRESS NOTE  Nicole Shaw ENI:[id number] DOB: [birth year] DOA: 05/11/2018 PCP: Rusty Aus, MD  Brief summary   77 y.o.femalewith medical history ofanxiety, fibromyalgia, hypertension, diabetes mellitus presenting with generalized weakness, dizziness for approximately 1 week, found to have hyponatremia.  The patient also complained of intermittent nausea and vomiting for approximately the past 2 days. She denies any fevers, chills, chest pain, shortness breath, abdominal pain but she complains of some dysuria. She denies any headache, neck pain. She denies any new over-the-counter medications. She states that she saw her primary care provider on 05/10/2018. At that time, the patient's diltiazem and Lexapro were discontinued. She was instructed to decrease her Micardis HCTZ to half a tablet daily. She was started on metoprolol succinate. She denies any other new medications. The patient denies drinking excessive amounts of water. She endorses compliance with all her mother medications.  In the emergency department, the patient was afebrile hemodynamically stable saturating 100% on room air. Patient was noted to have sodium 109. LFTs were unremarkable. WBC was 11.8. The patient was then started on hypertonic saline with BMP q 4 hours.    Assessment/Plan:  Hyponatremia. Likely multifactorial including decreased solute intake, vomiting, medications-HCTZ, a degree of SIADH. Urine osmolarity-428. Serum osmolarity-229. FeNa= 2.25%. TSH--1.295. Sodium on admission-109.  -slowly improving, required 3% sodium on admission, transitioned to normal saline. Cont close monitor. Continue to hold Lexapro and Micardis HCTZ  Acute metabolic encephalopathy. Secondary to hyponatremia.  -continues to improve. Neuro exam is non focal   Hypokalemia. Replete. Monitor   Essential hypertension. Stable. Decreased metoprolol due to mild bradycardia . holding Micardis HCTZ secondary  to hyponatremia  Diabetes mellitus type 2. Appears to be diet-controlled as the patient is not on any agents. Hemoglobin A1c--6.3  Hypothyroidism. Continue Synthroid. TSH--1.295  Intractable nausea and vomiting. may be in part due to the patient's severe hyponatremia. -improving. abd exam is benign. Continue PPI. Lipase normal;LFTs normal. As needed antiemetics. Diet as tolerated   Hyperlipidemia Continue statin   Code Status: full Family Communication: d/w patient, RN (indicate person spoken with, relationship, and if by phone, the number) Disposition Plan: home 24-48 hrs    Consultants:  none  Procedures:  none  Antibiotics:  none (indicate start date, and stop date if known)  HPI/Subjective: Alert. Reports feeling better. No acute distress.   Objective: Vitals:   05/13/18 0500 05/13/18 0600  BP: (!) 143/59 134/64  Pulse: (!) 56 (!) 55  Resp: 14 13  Temp:    SpO2: 100% 99%    Intake/Output Summary (Last 24 hours) at 05/13/2018 0743 Last data filed at 05/13/2018 0700 Gross per 24 hour  Intake 1190 ml  Output 1200 ml  Net -10 ml   Filed Weights   05/11/18 1250 05/12/18 0500 05/13/18 0317  Weight: 78 kg (171 lb 15.3 oz) 71.5 kg (157 lb 10.1 oz) 77.8 kg (171 lb 8.3 oz)    Exam:   General:  No distress   Cardiovascular: s1,s2 rrr  Respiratory: CTA BL  Abdomen: soft, nt nd   Musculoskeletal: no leg edema    Data Reviewed: Basic Metabolic Panel: Recent Labs  Lab 05/11/18 1542  05/12/18 1155 05/12/18 1605 05/12/18 2012 05/13/18 0000 05/13/18 0443  NA 109*   < > 121* 119* 120* 118* 124*  K 3.1*   < > 3.9 3.6 3.6 3.7 4.0  CL 75*   < > 89* 89* 92* 90* 96*  CO2 24   < > 24 23 22  21* 22  GLUCOSE 89   < > 90 96 119* 96 93  BUN 13   < > 14 13 14 13 12   CREATININE 0.90   < > 1.07* 1.13* 1.17* 1.11* 1.07*  CALCIUM 8.3*   < > 8.6* 8.6* 8.3* 8.1* 8.4*  MG 1.8  --   --   --   --   --   --    < > = values in this interval not displayed.   Liver  Function Tests: Recent Labs  Lab 05/11/18 1009  AST 28  ALT 24  ALKPHOS 73  BILITOT 1.1  PROT 7.1  ALBUMIN 4.1   Recent Labs  Lab 05/11/18 1009  LIPASE 29   Recent Labs  Lab 05/11/18 1009  AMMONIA <9*   CBC: Recent Labs  Lab 05/11/18 1009 05/12/18 0727  WBC 11.8* 10.0  NEUTROABS 8.9  --   HGB 11.8* 11.7*  HCT 32.0* 32.8*  MCV 81.6 83.9  PLT 269 238   Cardiac Enzymes: Recent Labs  Lab 05/11/18 1009  TROPONINI <0.03   BNP (last 3 results) No results for input(s): BNP in the last 8760 hours.  ProBNP (last 3 results) No results for input(s): PROBNP in the last 8760 hours.  CBG: No results for input(s): GLUCAP in the last 168 hours.  Recent Results (from the past 240 hour(s))  MRSA PCR Screening     Status: None   Collection Time: 05/11/18 12:43 PM  Result Value Ref Range Status   MRSA by PCR NEGATIVE NEGATIVE Final    Comment:        The GeneXpert MRSA Assay (FDA approved for NASAL specimens only), is one component of a comprehensive MRSA colonization surveillance program. It is not intended to diagnose MRSA infection nor to guide or monitor treatment for MRSA infections. Performed at [facility], [address] [place], [place] [address]      Studies: No results found.  Scheduled Meds: . aspirin EC  81 mg Oral Daily  . cholecalciferol  2,000 Units Oral Daily  . enoxaparin (LOVENOX) injection  40 mg Subcutaneous Q24H  . levothyroxine  100 mcg Oral QAC breakfast  . loratadine  10 mg Oral Daily  . metoprolol succinate  25 mg Oral Daily  . pantoprazole  40 mg Oral Daily  . simvastatin  10 mg Oral Daily   Continuous Infusions: . 0.9 % NaCl with KCl 20 mEq / L 75 mL/hr at 05/13/18 0600    Active Problems:   Hyponatremia   Acute metabolic encephalopathy   Intractable vomiting with nausea   Essential hypertension   Fibromyalgia    Time spent: >35 minutes     Kinnie Feil  [facility] Pager [phone]. If 7PM-7AM, please  contact night-coverage at [email or web address], password [facility] 05/13/2018, 7:43 AM  LOS: 2 days

## 2018-05-14 ENCOUNTER — Encounter (HOSPITAL_COMMUNITY): Payer: Self-pay

## 2018-05-14 LAB — BASIC METABOLIC PANEL
Anion gap: 4 — ABNORMAL LOW (ref 5–15)
Anion gap: 6 (ref 5–15)
BUN: 9 mg/dL (ref 8–23)
BUN: 13 mg/dL (ref 8–23)
CALCIUM: 8.2 mg/dL — AB (ref 8.9–10.3)
CO2: 23 mmol/L (ref 22–32)
CO2: 21 mmol/L — ABNORMAL LOW (ref 22–32)
CREATININE: 1.24 mg/dL — AB (ref 0.44–1.00)
Calcium: 8.4 mg/dL — ABNORMAL LOW (ref 8.9–10.3)
Chloride: 101 mmol/L (ref 98–111)
Chloride: 99 mmol/L (ref 98–111)
Creatinine, Ser: 1 mg/dL (ref 0.44–1.00)
GFR calc Af Amer: 47 mL/min — ABNORMAL LOW (ref >60)
GFR calc non Af Amer: 53 mL/min — ABNORMAL LOW (ref >60)
GFR, EST NON AFRICAN AMERICAN: 41 mL/min — AB (ref >60)
GLUCOSE: 153 mg/dL — AB (ref 70–99)
Glucose, Bld: 98 mg/dL (ref 70–99)
POTASSIUM: 4.6 mmol/L (ref 3.5–5.1)
POTASSIUM: 4.6 mmol/L (ref 3.5–5.1)
SODIUM: 128 mmol/L — AB (ref 135–145)
SODIUM: 126 mmol/L — AB (ref 135–145)

## 2018-05-14 MED ORDER — SODIUM CHLORIDE 0.9 % IV SOLN
INTRAVENOUS | Status: DC
  Administered 2018-05-14 – 2018-05-15 (×3): via INTRAVENOUS

## 2018-05-14 NOTE — Progress Notes (Signed)
[facility] PROGRESS NOTE  EDDITH MENTOR WYO:[id number] DOB: 06/16/1941 DOA: 05/11/2018 PCP: Rusty Aus, MD  Brief summary   77 y.o.femalewith medical history ofanxiety, fibromyalgia, hypertension, diabetes mellitus presenting with generalized weakness, dizziness for approximately 1 week, found to have hyponatremia.  The patient also complained of intermittent nausea and vomiting for approximately the past 2 days. She denies any fevers, chills, chest pain, shortness breath, abdominal pain but she complains of some dysuria. She denies any headache, neck pain. She denies any new over-the-counter medications. She states that she saw her primary care provider on 05/10/2018. At that time, the patient's diltiazem and Lexapro were discontinued. She was instructed to decrease her Micardis HCTZ to half a tablet daily. She was started on metoprolol succinate. She denies any other new medications. The patient denies drinking excessive amounts of water. She endorses compliance with all her mother medications.  In the emergency department, the patient was afebrile hemodynamically stable saturating 100% on room air. Patient was noted to have sodium 109. LFTs were unremarkable. WBC was 11.8. The patient was then started on hypertonic saline with BMP q 4 hours.    Assessment/Plan:  Hyponatremia. Likely multifactorial including decreased solute intake, vomiting, medications-HCTZ, a degree of SIADH. Urine osmolarity-428. Serum osmolarity-229. FeNa= 2.25%. TSH--1.295. Sodium on admission-109.  -slowly improving, required 3% sodium on admission, then transitioned to normal saline. Sodium is 128 today. Cont close monitor. Continue to hold Lexapro and Micardis HCTZ. Possible d/c home in AM if Na >774  Acute metabolic encephalopathy. Secondary to hyponatremia.  -continues to improve. Neuro exam is non focal   Hypokalemia. Repleted. Monitor   Essential hypertension. Stable. Decreased  metoprolol due to mild bradycardia . holding Micardis HCTZ secondary to hyponatremia  Diabetes mellitus type 2. Appears to be diet-controlled as the patient is not on any agents. Hemoglobin A1c--6.3  Hypothyroidism. Continue Synthroid. TSH--1.295  Intractable nausea and vomiting. may be in part due to the patient's severe hyponatremia. -improved. Tolerating diet.  abd exam is benign. Continue PPI. Lipase normal;LFTs normal. As needed antiemetics.  Hyperlipidemia Continue statin   Code Status: full Family Communication: d/w patient, RN (indicate person spoken with, relationship, and if by phone, the number) Disposition Plan: home likely tomorrow    Consultants:  none  Procedures:  none  Antibiotics:  none (indicate start date, and stop date if known)  HPI/Subjective: Alert. Reports feeling better. No acute distress.   Objective: Vitals:   05/14/18 0445 05/14/18 0715  BP:    Pulse:  60  Resp:  14  Temp: 97.9 F (36.6 C) 97.8 F (36.6 C)  SpO2:  95%    Intake/Output Summary (Last 24 hours) at 05/14/2018 0746 Last data filed at 05/14/2018 0400 Gross per 24 hour  Intake 1775 ml  Output 1400 ml  Net 375 ml   Filed Weights   05/12/18 0500 05/13/18 0317 05/14/18 0445  Weight: 71.5 kg (157 lb 10.1 oz) 77.8 kg (171 lb 8.3 oz) 75 kg (165 lb 5.5 oz)    Exam:   General:  No distress   Cardiovascular: s1,s2 rrr  Respiratory: CTA BL  Abdomen: soft, nt nd   Musculoskeletal: no leg edema    Data Reviewed: Basic Metabolic Panel: Recent Labs  Lab 05/11/18 1542  05/13/18 0000 05/13/18 0443 05/13/18 1149 05/13/18 1944 05/14/18 0408  NA 109*   < > 118* 124* 123* 124* 128*  K 3.1*   < > 3.7 4.0 3.9 3.8 4.6  CL 75*   < >  90* 96* 96* 97* 101  CO2 24   < > 21* 22 22 21* 23  GLUCOSE 89   < > 96 93 106* 191* 98  BUN 13   < > 13 12 11 11 9   CREATININE 0.90   < > 1.11* 1.07* 1.06* 1.02* 1.00  CALCIUM 8.3*   < > 8.1* 8.4* 8.2* 8.0* 8.4*  MG 1.8  --   --   --    --   --   --    < > = values in this interval not displayed.   Liver Function Tests: Recent Labs  Lab 05/11/18 1009  AST 28  ALT 24  ALKPHOS 73  BILITOT 1.1  PROT 7.1  ALBUMIN 4.1   Recent Labs  Lab 05/11/18 1009  LIPASE 29   Recent Labs  Lab 05/11/18 1009  AMMONIA <9*   CBC: Recent Labs  Lab 05/11/18 1009 05/12/18 0727  WBC 11.8* 10.0  NEUTROABS 8.9  --   HGB 11.8* 11.7*  HCT 32.0* 32.8*  MCV 81.6 83.9  PLT 269 238   Cardiac Enzymes: Recent Labs  Lab 05/11/18 1009  TROPONINI <0.03   BNP (last 3 results) No results for input(s): BNP in the last 8760 hours.  ProBNP (last 3 results) No results for input(s): PROBNP in the last 8760 hours.  CBG: No results for input(s): GLUCAP in the last 168 hours.  Recent Results (from the past 240 hour(s))  Culture, Urine     Status: None   Collection Time: 05/11/18  9:50 AM  Result Value Ref Range Status   Specimen Description   Final    URINE, CLEAN CATCH Performed at [facility], [address], [place], [place] [address]    Special Requests   Final    NONE Performed at [facility], [address], [place], [place] [address]    Culture   Final    NO GROWTH Performed at [facility], [place] [address] [place], [place] [address]    Report Status 05/13/2018 FINAL  Final  MRSA PCR Screening     Status: None   Collection Time: 05/11/18 12:43 PM  Result Value Ref Range Status   MRSA by PCR NEGATIVE NEGATIVE Final    Comment:        The GeneXpert MRSA Assay (FDA approved for NASAL specimens only), is one component of a comprehensive MRSA colonization surveillance program. It is not intended to diagnose MRSA infection nor to guide or monitor treatment for MRSA infections. Performed at [facility], [address] [place],  [address]      Studies: No results found.  Scheduled Meds: . aspirin EC  81 mg Oral Daily  . cholecalciferol  2,000 Units Oral Daily  . enoxaparin (LOVENOX) injection   40 mg Subcutaneous Q24H  . levothyroxine  100 mcg Oral QAC breakfast  . loratadine  10 mg Oral Daily  . metoprolol succinate  25 mg Oral Daily  . pantoprazole  40 mg Oral Daily  . simvastatin  10 mg Oral Daily   Continuous Infusions: . 0.9 % NaCl with KCl 20 mEq / L 75 mL/hr at 05/13/18 1907    Active Problems:   Hyponatremia   Acute metabolic encephalopathy   Intractable vomiting with nausea   Essential hypertension   Fibromyalgia    Time spent: >35 minutes     Kinnie Feil  [facility] Pager [phone]. If 7PM-7AM, please contact night-coverage at [email or web address], password [facility] 05/14/2018, 7:46  AM  LOS: 3 days

## 2018-05-15 LAB — BASIC METABOLIC PANEL
Anion gap: 6 (ref 5–15)
BUN: 11 mg/dL (ref 8–23)
CALCIUM: 8.5 mg/dL — AB (ref 8.9–10.3)
CO2: 22 mmol/L (ref 22–32)
CREATININE: 1.01 mg/dL — AB (ref 0.44–1.00)
Chloride: 102 mmol/L (ref 98–111)
GFR calc non Af Amer: 52 mL/min — ABNORMAL LOW (ref >60)
Glucose, Bld: 106 mg/dL — ABNORMAL HIGH (ref 70–99)
Potassium: 4.4 mmol/L (ref 3.5–5.1)
Sodium: 130 mmol/L — ABNORMAL LOW (ref 135–145)

## 2018-05-15 MED ORDER — METOPROLOL SUCCINATE ER 50 MG PO TB24: 50.0000 mg | ORAL_TABLET | Freq: Every day | ORAL | Status: DC

## 2018-05-15 NOTE — Progress Notes (Signed)
AVS paperwork reviewed with pt. All concerns and questions answered at this time. PIV removed. Pt transferred via w/c to private vehicle. Pt family member assisted with packing belongings.

## 2018-05-15 NOTE — Discharge Summary (Signed)
Physician Discharge Summary  Nicole Shaw OBS:[id number] DOB: December 29, 1940 DOA: 05/11/2018  PCP: Rusty Aus, MD  Admit date: 05/11/2018 Discharge date: 05/15/2018  Admitted From: Home Disposition:  Home  Recommendations for Outpatient Follow-up:  1. Follow up with PCP in 1-2 weeks 2. Please obtain BMP/CBC in one week   Home Health: YES Equipment/Devices:HHPT  Discharge Condition: Stable CODE STATUS: FULL Diet recommendation: Heart Healthy    Brief/Interim Summary: 77 y.o.femalewith medical history ofanxiety, fibromyalgia, hypertension, diabetes mellitus presenting with generalized weakness, dizziness for approximately 1 week. The patient also complained of intermittent nausea and vomiting for approximately the past 2 days. She denies any fevers, chills, chest pain, shortness breath, abdominal pain but she complains of some dysuria. She denies any headache, neck pain. She denies any new over-the-counter medications. She states that she saw her primary care provider on 05/10/2018. At that time, the patient's diltiazem and Lexapro were discontinued. She was instructed to decrease her Micardis HCTZ to half a tablet daily. She was started on metoprolol succinate. She denies any other new medications. The patient denies drinking excessive amounts of water. She endorses compliance with all her mother medications.  In the emergency department, the patient was afebrile hemodynamically stable saturating 100% on room air. Patient was noted to have sodium 109. LFTs were unremarkable. WBC was 11.8. The patient was then started on hypertonic saline with BMP q 4 hours.    Discharge Diagnoses:  Hyponatremia -Likely multifactorial including decreased solute intake, vomiting, medications, a degree of SIADH -Urine osmolarity-428 -Serum osmolarity-229 -FeNa= 2.25% -Continue to hold Lexapro and Micardis HCTZ--will not restart these meds -Check TSH--1.295 -Check a.m.  Cortisol--pending -05/12/18 @0700 --stopped 3% saline and recheck BMP to prevent over rapid correction -NS subsequently started after no improvement in Na -Na gradually improved with improved po intake and NS -Na = 130 on day of d/c  Acute metabolic encephalopathy -Secondary to hyponatremia -Check UA--neg for pyuria -improving with correction of Na -back to baseline at time of d/c  Hypokalemia -Replete -Check magnesium--1.8  Essential hypertension -Continue metoprolol succinate and adjust based upon the patient's blood pressure--home with 50 mg daily -Holding Micardis HCTZ secondary to hyponatremia  Diabetes mellitus type 2 -Appears to be diet-controlled as the patient is not on any agents -Hemoglobin A1c--6.3  Hypothyroidism -Continue Synthroid -Check TSH--1.295  Intractable nausea and vomiting -May be in part due to the patient's severe hyponatremia -improving but remains nauseous -Continue PPI -Lipase normal;LFTs normal -As needed antiemetics -advance to full liquids  Hyperlipidemia -Continue statin      Discharge Instructions   Allergies as of 05/15/2018      Reactions   Ace Inhibitors Other (See Comments)   cough   Penicillins    Sulfa Antibiotics    Amlodipine Palpitations      Medication List    STOP taking these medications   diltiazem 180 MG 24 hr capsule Commonly known as:  CARDIZEM CD   escitalopram 10 MG tablet Commonly known as:  LEXAPRO   loratadine 10 MG tablet Commonly known as:  CLARITIN   [facility] Caps   telmisartan-hydrochlorothiazide 80-12.5 MG tablet Commonly known as:  MICARDIS HCT     TAKE these medications   aspirin EC 81 MG tablet Take 1 tablet by mouth daily.   esomeprazole 20 MG capsule Commonly known as:  NEXIUM Take 1-2 capsules by mouth daily at 12 noon. 2 capsules in morning and 1 at night   levothyroxine 100 MCG tablet Commonly known as:  SYNTHROID,  LEVOTHROID Take 1 tablet by mouth  daily.   metoprolol succinate 50 MG 24 hr tablet Commonly known as:  TOPROL-XL Take 1 tablet by mouth daily.   ondansetron 4 MG tablet Commonly known as:  ZOFRAN Take 1 tablet by mouth 3 (three) times daily as needed.   simvastatin 10 MG tablet Commonly known as:  ZOCOR Take 1 tablet by mouth daily.   Vitamin D3 2000 units capsule Take 1 capsule by mouth daily.       Allergies  Allergen Reactions  . Ace Inhibitors Other (See Comments)    cough  . Penicillins   . Sulfa Antibiotics   . Amlodipine Palpitations    Consultations:  none   Procedures/Studies:  No results found.      Discharge Exam: Vitals:   05/15/18 1100 05/15/18 1157  BP:    Pulse:    Resp: 14   Temp:  99.3 F (37.4 C)  SpO2:     Vitals:   05/15/18 0943 05/15/18 1000 05/15/18 1100 05/15/18 1157  BP: (!) 165/66     Pulse: 75 86    Resp:  (!) 21 14   Temp:    99.3 F (37.4 C)  TempSrc:    Oral  SpO2:  97%    Weight:      Height:        General: Pt is alert, awake, not in acute distress Cardiovascular: RRR, S1/S2 +, no rubs, no gallops Respiratory: CTA bilaterally, no wheezing, no rhonchi Abdominal: Soft, NT, ND, bowel sounds + Extremities: no edema, no cyanosis   The results of significant diagnostics from this hospitalization (including imaging, microbiology, ancillary and laboratory) are listed below for reference.    Significant Diagnostic Studies: No results found.   Microbiology: Recent Results (from the past 240 hour(s))  Culture, Urine     Status: None   Collection Time: 05/11/18  9:50 AM  Result Value Ref Range Status   Specimen Description   Final    URINE, CLEAN CATCH Performed at [facility], [address], [place], [place] [address]    Special Requests   Final    NONE Performed at [facility], [address] [place], [place] [address]    Culture   Final    NO GROWTH Performed at [facility], [place] [address] [place], [place] [address]     Report Status 05/13/2018 FINAL  Final  MRSA PCR Screening     Status: None   Collection Time: 05/11/18 12:43 PM  Result Value Ref Range Status   MRSA by PCR NEGATIVE NEGATIVE Final    Comment:        The GeneXpert MRSA Assay (FDA approved for NASAL specimens only), is one component of a comprehensive MRSA colonization surveillance program. It is not intended to diagnose MRSA infection nor to guide or monitor treatment for MRSA infections. Performed at [facility], [address], [place], [place] [address]      Labs: Basic Metabolic Panel: Recent Labs  Lab 05/11/18 1542  05/13/18 1149 05/13/18 1944 05/14/18 0408 05/14/18 1912 05/15/18 0658  NA 109*   < > 123* 124* 128* 126* 130*  K 3.1*   < > 3.9 3.8 4.6 4.6 4.4  CL 75*   < > 96* 97* 101 99 102  CO2 24   < > 22 21* 23 21* 22  GLUCOSE 89   < > 106* 191* 98 153* 106*  BUN 13   < > 11 11 9  13 11  CREATININE 0.90   < > 1.06* 1.02* 1.00 1.24* 1.01*  CALCIUM 8.3*   < > 8.2* 8.0* 8.4* 8.2* 8.5*  MG 1.8  --   --   --   --   --   --    < > = values in this interval not displayed.   Liver Function Tests: Recent Labs  Lab 05/11/18 1009  AST 28  ALT 24  ALKPHOS 73  BILITOT 1.1  PROT 7.1  ALBUMIN 4.1   Recent Labs  Lab 05/11/18 1009  LIPASE 29   Recent Labs  Lab 05/11/18 1009  AMMONIA <9*   CBC: Recent Labs  Lab 05/11/18 1009 05/12/18 0727  WBC 11.8* 10.0  NEUTROABS 8.9  --   HGB 11.8* 11.7*  HCT 32.0* 32.8*  MCV 81.6 83.9  PLT 269 238   Cardiac Enzymes: Recent Labs  Lab 05/11/18 1009  TROPONINI <0.03   BNP: Invalid input(s): POCBNP CBG: No results for input(s): GLUCAP in the last 168 hours.  Time coordinating discharge:  36 minutes  Signed:  Orson Eva, DO [facility] Pager: [phone] 05/15/2018, 4:33 PM

## 2018-05-15 NOTE — Care Management (Signed)
Patient Information   Patient Name Nicole Shaw, Nicole Shaw ([id number]) Sex Female DOB [birth year]  Room Bed  IC01 IC01-01  Patient Demographics   Address [place] [address] Phone [phone] [facility]) [phone] (Mobile)  Patient Ethnicity & Race   Ethnic Group Patient Race  Not Hispanic or Latino White or Caucasian  Emergency Contact(s)   Name Relation Home Work Mobile  [place] Relative [phone]    Eugenie Norrie Other   [place]  [phone]    Documents on File    Status Date Received Description  Documents for the Patient  [place] Not Received    [facility] E-Signature HIPAA Notice of Privacy Received 27/74/12   Driver's License Received 87/86/76 exp [id number]  Insurance Card Received 08/16/17 mcr & bcbs supp  Advance Directives/Living Will/HCPOA/POA Not Received    Other Photo ID Not Received    AMB Correspondence  03/27/15 [place] Card Received 08/16/17 NEW MEDICARE CARD 2018  Documents for the Encounter  AOB (Assignment of Insurance Benefits) Not Received    E-signature AOB Signed 05/11/18   MEDICARE RIGHTS Not Received    E-signature Medicare Rights Signed 05/11/18   Cardiac Monitoring Strip Shift Summary Received 05/11/18   Cardiac Monitoring Strip Received 05/13/18   ED Patient Billing Extract   ED PB Billing Extract  Admission Information   Attending Provider Admitting Provider Admission Type Admission Date/Time  Tat, Shanon Brow, MD Orson Eva, MD Emergency 05/11/18 [phone]  Discharge Date Hospital Service Auth/Cert Status Service Area   Internal Medicine Incomplete [facility]  Unit Room/Bed Admission Status   AP-ICCUP NURSING IC01/IC01-01 Admission (Confirmed)   Admission   Complaint  hypertension  Hospital Account   Name Acct ID Class Status Primary Coverage  Nicole Shaw, Nicole Shaw [id number] Inpatient Open MEDICARE - MEDICARE PART A AND B      Guarantor  Account (for [place] [id number])   Name Relation to Pt Service Area Active? Acct Type  Nicole Shaw, Nicole Shaw Yes Personal/Family  Address Phone    [address] [place], [place] [address] [phone])        Coverage Information (for Hospital Account [id number])   1. [place] PART A AND B   F/O Payor/Plan Precert #  MEDICARE/MEDICARE PART A AND B   Subscriber Subscriber #  Nicole Shaw, Nicole Shaw [id number]  Address Phone  [address] [place], [place] [address]   2. BLUE CROSS BLUE SHIELD/BCBS SUPPLEMENT   F/O Payor/Plan Precert #  BLUE CROSS BLUE SHIELD/BCBS SUPPLEMENT   Subscriber Subscriber #  Nicole Shaw, Nicole Shaw [id number]  Address Phone  [address] [place], [place] [address]

## 2018-05-15 NOTE — Care Management Important Message (Signed)
Important Message  Patient Details  Name: Nicole Shaw MRN: [id number] Date of Birth: December 30, 1940   Medicare Important Message Given:  Yes    [name] 05/15/2018, 12:42 PM

## 2018-05-15 NOTE — Evaluation (Signed)
Physical Therapy Evaluation Patient Details Name: Nicole Shaw MRN: [id number] DOB: 10/12/1941 Today's Date: 05/15/2018   History of Present Illness  Nicole Shaw is a 77 y.o. female with medical history of anxiety, fibromyalgia, hypertension, diabetes mellitus presenting with generalized weakness, dizziness for approximately 1 week.  The patient also complained of intermittent nausea and vomiting for approximately the past 2 days.  She denies any fevers, chills, chest pain, shortness breath, abdominal pain but she complains of some dysuria.  She denies any headache, neck pain.  She denies any new over-the-counter medications.  She states that she saw her primary care provider on 05/10/2018.  At that time, the patient's diltiazem and Lexapro were discontinued.  She was instructed to decrease her Micardis HCTZ to half a tablet daily.  She was started on metoprolol succinate.  She denies any other new medications.  The patient denies drinking excessive amounts of water.  She endorses compliance with all her mother medications.  She states that she normally has intermittent nausea and vomiting that has been chronic secondary to her hiatus hernia, but feels that it has been more frequent in the past 2 days.  There is no hematemesis or melena.  Because of her generalized weakness, dizziness, and confusion, the patient presented to the emergency department.  In the emergency department, the patient was afebrile hemodynamically stable saturating 100% on room air.  Patient was noted to have sodium 109.  LFTs were unremarkable.  WBC was 11.8.    Clinical Impression  Patient functioning near baseline for functional mobility and gait other than occasional leaning on objects for support during gait training without loss of balance, tends to drift right/left occasionally, but able to self correct.  Plan:  Patient discharged from physical therapy to care of nursing for ambulation daily as tolerated for length of  stay.    Follow Up Recommendations Home health PT;Supervision - Intermittent    Equipment Recommendations  None recommended by PT    Recommendations for Other Services       Precautions / Restrictions Precautions Precautions: Fall Restrictions Weight Bearing Restrictions: No      Mobility  Bed Mobility Overal bed mobility: Independent                Transfers Overall transfer level: Modified independent                  Ambulation/Gait Ambulation/Gait assistance: Modified independent (Device/Increase time) Gait Distance (Feet): 200 Feet Assistive device: 1 person hand held assist Gait Pattern/deviations: Decreased step length - right;Decreased step length - left;Decreased stride length;Drifts right/left Gait velocity: decreased   General Gait Details: tendency to lean on nearby objects for support and requested hand held assist initially during gait training, able to ambulate without assistance when slowing cadence  Stairs            Wheelchair Mobility    Modified Rankin (Stroke Patients Only)       Balance Overall balance assessment: Mild deficits observed, not formally tested                                           Pertinent Vitals/Pain Pain Assessment: No/denies pain    Home Living Family/patient expects to be discharged to:: Private residence Living Arrangements: Other relatives Available Help at Discharge: Family Type of Home: House Home Access: Ramped entrance     Home  Layout: One level Home Equipment: Walker - 2 wheels;Cane - single point;Wheelchair - manual;Bedside commode Additional Comments: 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    Prior Function Level of Independence: Independent         Comments: Hydrographic surveyor, drives, 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     Journalist, newspaper        Extremity/Trunk Assessment   Upper Extremity Assessment Upper Extremity Assessment: Overall WFL for tasks  assessed    Lower Extremity Assessment Lower Extremity Assessment: Generalized weakness    Cervical / Trunk Assessment Cervical / Trunk Assessment: Normal  Communication   Communication: No difficulties  Cognition Arousal/Alertness: Awake/alert Behavior During Therapy: WFL for tasks assessed/performed Overall Cognitive Status: Within Functional Limits for tasks assessed                                        General Comments      Exercises     Assessment/Plan    PT Assessment All further PT needs can be met in the next venue of care  PT Problem List Decreased strength;Decreased activity tolerance;Decreased balance;Decreased mobility       PT Treatment Interventions      PT Goals (Current goals can be found in the Care Plan section)  Acute Rehab PT Goals Patient Stated Goal: return home  PT Goal Formulation: With patient Time For Goal Achievement: 05-22-18 Potential to Achieve Goals: Good    Frequency     Barriers to discharge        Co-evaluation               AM-PAC PT "6 Clicks" Daily Activity  Outcome Measure Difficulty turning over in bed (including adjusting bedclothes, sheets and blankets)?: None Difficulty moving from lying on back to sitting on the side of the bed? : None Difficulty sitting down on and standing up from a chair with arms (e.g., wheelchair, bedside commode, etc,.)?: None Help needed moving to and from a bed to chair (including a wheelchair)?: None Help needed walking in hospital room?: A Little Help needed climbing 3-5 steps with a railing? : A Little 6 Click Score: 22    End of Session   Activity Tolerance: Patient tolerated treatment well Patient left: in bed;with call bell/phone within reach(seated at bedside) Nurse Communication: Mobility status PT Visit Diagnosis: Unsteadiness on feet (R26.81);Other abnormalities of gait and mobility (R26.89);Muscle weakness (generalized) (M62.81)    Time: 4944-9675 PT  Time Calculation (min) (ACUTE ONLY): 24 min   Charges:   PT Evaluation $PT Eval Moderate Complexity: 1 Mod PT Treatments $Therapeutic Activity: 23-37 mins   PT G Codes:        12:24 PM, 22-May-2018 Lonell Grandchild, MPT Physical Therapist with [facility] 336 [phone] office [phone] mobile phone

## 2018-05-15 NOTE — Care Management Note (Signed)
Case Management Note  Patient Details  Name: Nicole Shaw MRN: [id number] Date of Birth: 12/09/1940  Subjective/Objective:  Hyponatremia. From home with sister. 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 Independent, no assistive devices. Has PCP- Dr. Emily Filbert.              Action/Plan: Recommended for HH PT. Patient elects [facility] health. Will send referral when home health orders are available.   Expected Discharge Date:     05/16/2018             Expected Discharge Plan:  [place]  In-House Referral:     Discharge planning Services  CM Consult  Post Acute Care Choice:  Home Health Choice offered to:  Patient  DME Arranged:    DME Agency:     HH Arranged:  PT [place]:  [facility]  Status of Service:  Completed, signed off  If discussed at [place] of Stay Meetings, dates discussed:    Additional Comments:  Latishia Suitt, Chauncey Reading, RN 05/15/2018, 1:57 PM

## 2018-07-24 ENCOUNTER — Other Ambulatory Visit: Payer: Self-pay | Admitting: Obstetrics & Gynecology

## 2018-07-24 DIAGNOSIS — Z1231 Encounter for screening mammogram for malignant neoplasm of breast: Secondary | ICD-10-CM

## 2018-08-18 ENCOUNTER — Ambulatory Visit
Admission: RE | Admit: 2018-08-18 | Discharge: 2018-08-18 | Disposition: A | Discharge status: Home / Self Care | Payer: Medicare Other | Source: Ambulatory Visit | Attending: Obstetrics & Gynecology | Admitting: Obstetrics & Gynecology

## 2018-08-18 DIAGNOSIS — Z1231 Encounter for screening mammogram for malignant neoplasm of breast: Secondary | ICD-10-CM

## 2018-10-03 DIAGNOSIS — E782 Mixed hyperlipidemia: Secondary | ICD-10-CM | POA: Insufficient documentation

## 2018-10-19 DIAGNOSIS — D510 Vitamin B12 deficiency anemia due to intrinsic factor deficiency: Secondary | ICD-10-CM | POA: Insufficient documentation

## 2018-10-19 DIAGNOSIS — N183 Chronic kidney disease, stage 3 unspecified: Secondary | ICD-10-CM | POA: Insufficient documentation

## 2019-07-11 ENCOUNTER — Other Ambulatory Visit: Payer: Self-pay | Admitting: Obstetrics & Gynecology

## 2019-07-11 DIAGNOSIS — Z1231 Encounter for screening mammogram for malignant neoplasm of breast: Secondary | ICD-10-CM

## 2019-08-21 ENCOUNTER — Ambulatory Visit
Admission: RE | Admit: 2019-08-21 | Discharge: 2019-08-21 | Disposition: A | Discharge status: Home / Self Care | Payer: Medicare Other | Source: Ambulatory Visit | Attending: Obstetrics & Gynecology | Admitting: Obstetrics & Gynecology

## 2019-08-21 DIAGNOSIS — Z1231 Encounter for screening mammogram for malignant neoplasm of breast: Secondary | ICD-10-CM

## 2019-10-18 ENCOUNTER — Emergency Department: Payer: Medicare Other

## 2019-10-18 ENCOUNTER — Emergency Department
Admission: EM | Admit: 2019-10-18 | Discharge: 2019-10-19 | Disposition: A | Discharge status: Home / Self Care | Payer: Medicare Other | Attending: Emergency Medicine | Admitting: Emergency Medicine

## 2019-10-18 ENCOUNTER — Other Ambulatory Visit: Payer: Self-pay

## 2019-10-18 ENCOUNTER — Encounter: Payer: Self-pay | Admitting: Emergency Medicine

## 2019-10-18 DIAGNOSIS — R002 Palpitations: Secondary | ICD-10-CM

## 2019-10-18 DIAGNOSIS — F410 Panic disorder [episodic paroxysmal anxiety] without agoraphobia: Secondary | ICD-10-CM | POA: Insufficient documentation

## 2019-10-18 DIAGNOSIS — Z7982 Long term (current) use of aspirin: Secondary | ICD-10-CM | POA: Insufficient documentation

## 2019-10-18 DIAGNOSIS — Z79899 Other long term (current) drug therapy: Secondary | ICD-10-CM | POA: Diagnosis not present

## 2019-10-18 DIAGNOSIS — I10 Essential (primary) hypertension: Secondary | ICD-10-CM | POA: Insufficient documentation

## 2019-10-18 DIAGNOSIS — E119 Type 2 diabetes mellitus without complications: Secondary | ICD-10-CM | POA: Diagnosis not present

## 2019-10-18 LAB — BASIC METABOLIC PANEL
Anion gap: 11 (ref 5–15)
BUN: 19 mg/dL (ref 8–23)
CO2: 22 mmol/L (ref 22–32)
Calcium: 9.6 mg/dL (ref 8.9–10.3)
Chloride: 98 mmol/L (ref 98–111)
Creatinine, Ser: 1.15 mg/dL — ABNORMAL HIGH (ref 0.44–1.00)
GFR calc Af Amer: 52 mL/min — ABNORMAL LOW (ref >60)
GFR calc non Af Amer: 45 mL/min — ABNORMAL LOW (ref >60)
Glucose, Bld: 149 mg/dL — ABNORMAL HIGH (ref 70–99)
Potassium: 3.9 mmol/L (ref 3.5–5.1)
Sodium: 131 mmol/L — ABNORMAL LOW (ref 135–145)

## 2019-10-18 LAB — CBC
HCT: 33.7 % — ABNORMAL LOW (ref 36.0–46.0)
Hemoglobin: 11.1 g/dL — ABNORMAL LOW (ref 12.0–15.0)
MCH: 29.4 pg (ref 26.0–34.0)
MCHC: 32.9 g/dL (ref 30.0–36.0)
MCV: 89.2 fL (ref 80.0–100.0)
Platelets: 229 10*3/uL (ref 150–400)
RBC: 3.78 MIL/uL — ABNORMAL LOW (ref 3.87–5.11)
RDW: 12.9 % (ref 11.5–15.5)
WBC: 9.3 10*3/uL (ref 4.0–10.5)
nRBC: 0 % (ref 0.0–0.2)

## 2019-10-18 LAB — TROPONIN I (HIGH SENSITIVITY): Troponin I (High Sensitivity): 7 ng/L (ref <18)

## 2019-10-18 MED ORDER — SODIUM CHLORIDE 0.9% FLUSH: 3.0000 mL | Freq: Once | INTRAVENOUS | Status: DC

## 2019-10-18 NOTE — ED Triage Notes (Signed)
Pt walks to triage with steady gait reports BP running high all day, reports prior to coming to ER BP was greater than [id number] systolic, pt reports she has been taking all her medications as p[rescribed, Pt reports feels like her heart is "pounding" Pt denies any other symptom, pt talks in complete sentences no respiratory distress noted

## 2019-10-19 ENCOUNTER — Other Ambulatory Visit: Payer: Self-pay

## 2019-10-19 DIAGNOSIS — F410 Panic disorder [episodic paroxysmal anxiety] without agoraphobia: Secondary | ICD-10-CM | POA: Diagnosis not present

## 2019-10-19 LAB — TROPONIN I (HIGH SENSITIVITY): Troponin I (High Sensitivity): 11 ng/L (ref <18)

## 2019-10-19 MED ORDER — LORAZEPAM 0.5 MG PO TABS
0.5000 mg | ORAL_TABLET | Freq: Once | ORAL | Status: AC
  Administered 2019-10-19: 0.5 mg via ORAL
  Filled 2019-10-19: qty 1

## 2019-10-19 MED ORDER — LORAZEPAM 0.5 MG PO TABS: 0.5000 mg | ORAL_TABLET | Freq: Three times a day (TID) | ORAL | 0 refills | Status: AC | PRN

## 2019-10-19 NOTE — ED Provider Notes (Addendum)
[facility] Emergency Department Provider Note ______________   First MD Initiated Contact with Patient 10/19/19 [phone]     (approximate)  I have reviewed the triage vital signs and the nursing notes.   HISTORY  Chief Complaint Hypertension and Chest Pain    HPI Nicole Shaw is a 78 y.o. female with below list of previous medical conditions including hypertension hiatal hernia GERD diabetes panic attacks and anxiety, fibromyalgia presents to the emergency department secondary to elevated blood pressure at home "all day today".  Patient states that her systolic blood pressure exceeded 200.  Patient states "my heart is been pounding for a long time".  Patient denies any chest pain no shortness of breath no diaphoresis.  Patient does admit to feelings of anxiety for which she stated that she took Lexapro which expired 6 months ago.        Past Medical History:  Diagnosis Date  . Anemia   . Anxiety   . Diabetes mellitus without complication ([place])   . Fibromyalgia   . GERD (gastroesophageal reflux disease)   . Hiatal hernia   . Hypertension   . Panic attacks     Patient Active Problem List   Diagnosis Date Noted  . Hyponatremia 05/11/2018  . Acute metabolic encephalopathy [id number]  . Intractable vomiting with nausea 05/11/2018  . Essential hypertension 05/11/2018  . Fibromyalgia 05/11/2018    No past surgical history on file.  Prior to Admission medications   Medication Sig Start Date End Date Taking? Authorizing Provider  aspirin EC 81 MG tablet Take 1 tablet by mouth daily.    [provider]  Cholecalciferol (VITAMIN D3) 2000 units capsule Take 1 capsule by mouth daily.    [provider]  esomeprazole (NEXIUM) 20 MG capsule Take 1-2 capsules by mouth daily at 12 noon. 2 capsules in morning and 1 at night 04/19/18   [provider]  levothyroxine (SYNTHROID, LEVOTHROID) 100 MCG tablet Take 1 tablet by mouth daily.  04/19/18   [provider]  metoprolol succinate (TOPROL-XL) 50 MG 24 hr tablet Take 1 tablet by mouth daily. 05/09/18 05/09/19  [provider]  ondansetron (ZOFRAN) 4 MG tablet Take 1 tablet by mouth 3 (three) times daily as needed. 05/10/18   [provider]  simvastatin (ZOCOR) 10 MG tablet Take 1 tablet by mouth daily. 04/19/18   [provider]    Allergies Ace inhibitors, Penicillins, Sulfa antibiotics, and Amlodipine  Family History  Problem Relation Age of Onset  . Breast cancer Sister 15  . Breast cancer Maternal Aunt     Social History Social History   Tobacco Use  . Smoking status: Never Smoker  . Smokeless tobacco: Never Used  Substance Use Topics  . Alcohol use: Never  . Drug use: Never    Review of Systems Constitutional: No fever/chills Eyes: No visual changes. ENT: No sore throat. Cardiovascular: Denies chest pain. Respiratory: Denies shortness of breath. Gastrointestinal: No abdominal pain.  No nausea, no vomiting.  No diarrhea.  No constipation. Genitourinary: Negative for dysuria. Musculoskeletal: Negative for neck pain.  Negative for back pain. Integumentary: Negative for rash. Neurological: Negative for headaches, focal weakness or numbness. Psychiatric:  Positive for feelings of anxiety   ____________________________________________   PHYSICAL EXAM:  VITAL SIGNS: ED Triage Vitals  Enc Vitals Group     BP 10/18/19 2110 (!) 186/77     Pulse Rate 10/18/19 2110 77     Resp 10/18/19 2110 16  Temp 10/18/19 2110 98.2 F (36.8 C)     Temp Source 10/18/19 2110 Oral     SpO2 10/18/19 2110 99 %     Weight 10/18/19 2113 73.5 kg (162 lb)     Height 10/18/19 2113 1.575 m (5\' 2" )     Head Circumference --      Peak Flow --      Pain Score 10/18/19 2112 5     Pain Loc --      Pain Edu? --      Excl. in [place]? --     Constitutional: Alert and oriented.  Eyes: Conjunctivae are normal.  Mouth/Throat: Patient is  wearing a mask. Neck: No stridor.  No meningeal signs.   Cardiovascular: Normal rate, regular rhythm. Good peripheral circulation. Grossly normal heart sounds. Respiratory: Normal respiratory effort.  No retractions. Gastrointestinal: Soft and nontender. No distention.  Musculoskeletal: No lower extremity tenderness nor edema. No gross deformities of extremities. Neurologic:  Normal speech and language. No gross focal neurologic deficits are appreciated.  Skin:  Skin is warm, dry and intact. Psychiatric: Anxious affect.  Speech and behavior are normal.  ____________________________________________   LABS (all labs ordered are listed, but only abnormal results are displayed)  Labs Reviewed  BASIC METABOLIC PANEL - Abnormal; Notable for the following components:      Result Value   Sodium 131 (*)    Glucose, Bld 149 (*)    Creatinine, Ser 1.15 (*)    GFR calc non Af Amer 45 (*)    GFR calc Af Amer 52 (*)    All other components within normal limits  CBC - Abnormal; Notable for the following components:   RBC 3.78 (*)    Hemoglobin 11.1 (*)    HCT 33.7 (*)    All other components within normal limits  TROPONIN I (HIGH SENSITIVITY)  TROPONIN I (HIGH SENSITIVITY)   ____________________________________________  EKG  ED ECG REPORT I, [place] N Maudy Yonan, the attending physician, personally viewed and interpreted this ECG.   Date: 10/18/2019  EKG Time: 9:20 PM  Rate: 75  Rhythm: Normal sinus rhythm  Axis: Normal  Intervals: Normal  ST&T Change: None  ED ECG REPORT I, [place] N Jawanda Passey, the attending physician, personally viewed and interpreted this ECG.   Date: 10/19/2019  EKG Time: 12:17 AM  Rate: 76  Rhythm: Normal sinus rhythm  Axis: Normal  Intervals: Normal  ST&T Change: None  ____________________________________________  RADIOLOGY I, [place] N Jayln Branscom, personally viewed and evaluated these images (plain radiographs) as part of my medical decision making, as well  as reviewing the written report by the radiologist.  ED MD interpretation: Chest x-ray revealed no active cardiopulmonary disease per radiologist.  Official radiology report(s): DG Chest 2 View  Result Date: 10/18/2019 CLINICAL DATA:  78 year old female with chest pain. EXAM: CHEST - 2 VIEW COMPARISON:  None. FINDINGS: No focal consolidation, pleural effusion, or pneumothorax. The cardiac silhouette is within normal limits. No acute osseous pathology. IMPRESSION: No active cardiopulmonary disease. Electronically Signed   By: Anner Crete M.D.   On: 10/18/2019 21:50     Procedures   ____________________________________________   INITIAL IMPRESSION / MDM / [place] / ED COURSE  As part of my medical decision making, I reviewed the following data within the electronic MEDICAL RECORD NUMBER  78 year old female presented with above-stated history and physical exam with differential diagnosis including but not limited to cardiac arrhythmia, anxiety.  EKG x2 reveals no evidence of cardiac arrhythmia  laboratory data including high-sensitivity troponin unremarkable.  Given patient's stated history of anxiety and panic attacks and reported history of the same tonight patient given Ativan 0.5 mg with improvement of anxiety.  Patient will be discharged home with recommendation to follow-up with Dr. Arsenio Katz and primary care provider.  ____________________________________________  FINAL CLINICAL IMPRESSION(S) / ED DIAGNOSES  Final diagnoses:  Palpitations  Panic attack     MEDICATIONS GIVEN DURING THIS VISIT:  Medications  sodium chloride flush (NS) 0.9 % injection 3 mL (has no administration in time range)  LORazepam (ATIVAN) tablet 0.5 mg (0.5 mg Oral Given 10/19/19 0048)     ED Discharge Orders    None      *Please note:  Nicole Shaw was evaluated in Emergency Department on 10/19/2019 for the symptoms described in the history of present illness. She was evaluated in  the context of the global COVID-19 pandemic, which necessitated consideration that the patient might be at risk for infection with the SARS-CoV-2 virus that causes COVID-19. Institutional protocols and algorithms that pertain to the evaluation of patients at risk for COVID-19 are in a state of rapid change based on information released by regulatory bodies including the CDC and federal and state organizations. These policies and algorithms were followed during the patient's care in the ED.  Some ED evaluations and interventions may be delayed as a result of limited staffing during the pandemic.*  Note:  This document was prepared using Dragon voice recognition software and may include unintentional dictation errors.   Gregor Hams, MD 10/19/19 [id number]    Gregor Hams, MD 10/19/19 [phone]

## 2020-03-12 ENCOUNTER — Other Ambulatory Visit: Payer: Self-pay | Admitting: Obstetrics & Gynecology

## 2020-03-12 DIAGNOSIS — Z1231 Encounter for screening mammogram for malignant neoplasm of breast: Secondary | ICD-10-CM

## 2020-04-30 DIAGNOSIS — F330 Major depressive disorder, recurrent, mild: Secondary | ICD-10-CM | POA: Insufficient documentation

## 2020-08-26 ENCOUNTER — Ambulatory Visit
Admission: RE | Admit: 2020-08-26 | Discharge: 2020-08-26 | Disposition: A | Discharge status: Home / Self Care | Payer: Medicare Other | Source: Ambulatory Visit | Attending: Obstetrics & Gynecology | Admitting: Obstetrics & Gynecology

## 2020-08-26 ENCOUNTER — Other Ambulatory Visit: Payer: Self-pay

## 2020-08-26 DIAGNOSIS — Z1231 Encounter for screening mammogram for malignant neoplasm of breast: Secondary | ICD-10-CM | POA: Diagnosis not present

## 2021-04-30 ENCOUNTER — Encounter: Payer: Self-pay | Admitting: Ophthalmology

## 2021-05-18 NOTE — Discharge Instructions (Signed)

## 2021-05-19 ENCOUNTER — Ambulatory Visit
Admission: RE | Admit: 2021-05-19 | Discharge: 2021-05-19 | Disposition: A | Discharge status: Home / Self Care | Payer: Medicare Other | Attending: Ophthalmology | Admitting: Ophthalmology

## 2021-05-19 ENCOUNTER — Encounter
Admission: RE | Disposition: A | Discharge status: Home / Self Care | Payer: Self-pay | Source: Home / Self Care | Attending: Ophthalmology

## 2021-05-19 ENCOUNTER — Other Ambulatory Visit: Payer: Self-pay

## 2021-05-19 ENCOUNTER — Encounter: Payer: Self-pay | Admitting: Ophthalmology

## 2021-05-19 ENCOUNTER — Ambulatory Visit: Payer: Medicare Other | Admitting: Anesthesiology

## 2021-05-19 DIAGNOSIS — E1136 Type 2 diabetes mellitus with diabetic cataract: Secondary | ICD-10-CM | POA: Diagnosis present

## 2021-05-19 DIAGNOSIS — Z79899 Other long term (current) drug therapy: Secondary | ICD-10-CM | POA: Insufficient documentation

## 2021-05-19 DIAGNOSIS — H2512 Age-related nuclear cataract, left eye: Secondary | ICD-10-CM | POA: Insufficient documentation

## 2021-05-19 DIAGNOSIS — Z79890 Hormone replacement therapy (postmenopausal): Secondary | ICD-10-CM | POA: Diagnosis not present

## 2021-05-19 DIAGNOSIS — Z882 Allergy status to sulfonamides status: Secondary | ICD-10-CM | POA: Diagnosis not present

## 2021-05-19 DIAGNOSIS — Z880 Allergy status to penicillin: Secondary | ICD-10-CM | POA: Diagnosis not present

## 2021-05-19 DIAGNOSIS — Z888 Allergy status to other drugs, medicaments and biological substances status: Secondary | ICD-10-CM | POA: Insufficient documentation

## 2021-05-19 DIAGNOSIS — Z7982 Long term (current) use of aspirin: Secondary | ICD-10-CM | POA: Insufficient documentation

## 2021-05-19 HISTORY — PX: CATARACT EXTRACTION W/PHACO: SHX586

## 2021-05-19 SURGERY — PHACOEMULSIFICATION, CATARACT, WITH IOL INSERTION
Anesthesia: Monitor Anesthesia Care | Site: Eye | Laterality: Left

## 2021-05-19 MED ORDER — FENTANYL CITRATE (PF) 100 MCG/2ML IJ SOLN
INTRAMUSCULAR | Status: DC | PRN
  Administered 2021-05-19: 50 ug via INTRAVENOUS

## 2021-05-19 MED ORDER — SIGHTPATH DOSE#1 BSS IO SOLN
INTRAOCULAR | Status: DC | PRN
  Administered 2021-05-19: 15 mL via INTRAOCULAR

## 2021-05-19 MED ORDER — SIGHTPATH DOSE#1 NA CHONDROIT SULF-NA HYALURON 40-17 MG/ML IO SOLN
INTRAOCULAR | Status: DC | PRN
  Administered 2021-05-19: 1 mL via INTRAOCULAR

## 2021-05-19 MED ORDER — BRIMONIDINE TARTRATE-TIMOLOL 0.2-0.5 % OP SOLN
OPHTHALMIC | Status: DC | PRN
  Administered 2021-05-19: 1 [drp] via OPHTHALMIC

## 2021-05-19 MED ORDER — TETRACAINE HCL 0.5 % OP SOLN
1.0000 [drp] | OPHTHALMIC | Status: DC | PRN
  Administered 2021-05-19 (×3): 1 [drp] via OPHTHALMIC

## 2021-05-19 MED ORDER — SIGHTPATH DOSE#1 BSS IO SOLN
INTRAOCULAR | Status: DC | PRN
  Administered 2021-05-19: 2 mL

## 2021-05-19 MED ORDER — CYCLOPENTOLATE HCL 2 % OP SOLN
1.0000 [drp] | OPHTHALMIC | Status: AC
  Administered 2021-05-19 (×3): 1 [drp] via OPHTHALMIC

## 2021-05-19 MED ORDER — SIGHTPATH DOSE#1 BSS IO SOLN
INTRAOCULAR | Status: DC | PRN
  Administered 2021-05-19: 99 mL via OPHTHALMIC

## 2021-05-19 MED ORDER — MOXIFLOXACIN HCL 0.5 % OP SOLN
OPHTHALMIC | Status: DC | PRN
  Administered 2021-05-19: 0.2 mL via OPHTHALMIC

## 2021-05-19 MED ORDER — MIDAZOLAM HCL 2 MG/2ML IJ SOLN
INTRAMUSCULAR | Status: DC | PRN
  Administered 2021-05-19: 1 mg via INTRAVENOUS

## 2021-05-19 MED ORDER — PHENYLEPHRINE HCL 10 % OP SOLN
1.0000 [drp] | OPHTHALMIC | Status: AC
  Administered 2021-05-19 (×3): 1 [drp] via OPHTHALMIC

## 2021-05-19 SURGICAL SUPPLY — 16 items
CANNULA ANT/CHMB 27GA (MISCELLANEOUS) ×4 IMPLANT
GLOVE SURG ENC TEXT LTX SZ8 (GLOVE) ×2 IMPLANT
GLOVE SURG TRIUMPH 8.0 PF LTX (GLOVE) ×2 IMPLANT
GOWN STRL REUS W/ TWL LRG LVL3 (GOWN DISPOSABLE) ×2 IMPLANT
GOWN STRL REUS W/TWL LRG LVL3 (GOWN DISPOSABLE) ×4
LENS IOL ACRSF IQ VT 15 16.5 ×1 IMPLANT
LENS IOL ACRYSOF VIVITY 16.5 ×2 IMPLANT
LENS IOL VIVITY 015 16.5 ×1 IMPLANT
MARKER SKIN DUAL TIP RULER LAB (MISCELLANEOUS) ×2 IMPLANT
NEEDLE FILTER BLUNT 18X 1/2SAF (NEEDLE) ×1
NEEDLE FILTER BLUNT 18X1 1/2 (NEEDLE) ×1 IMPLANT
PACK EYE AFTER SURG (MISCELLANEOUS) ×2 IMPLANT
SYR 3ML LL SCALE MARK (SYRINGE) ×2 IMPLANT
SYR TB 1ML LUER SLIP (SYRINGE) ×2 IMPLANT
WATER STERILE IRR 250ML POUR (IV SOLUTION) ×2 IMPLANT
WIPE NON LINTING 3.25X3.25 (MISCELLANEOUS) ×2 IMPLANT

## 2021-05-19 NOTE — Anesthesia Postprocedure Evaluation (Signed)
Anesthesia Post Note  Patient: Nicole Shaw  Procedure(s) Performed: CATARACT EXTRACTION PHACO AND INTRAOCULAR LENS PLACEMENT (IOC) LEFT  DIABETIC VIVITY LENS 7.37 01:04.6 (Left: Eye)     Patient location during evaluation: PACU Anesthesia Type: MAC Level of consciousness: awake Pain management: pain level controlled Vital Signs Assessment: post-procedure vital signs reviewed and stable Respiratory status: respiratory function stable Cardiovascular status: stable Postop Assessment: no apparent nausea or vomiting Anesthetic complications: no   No notable events documented.  Veda Canning

## 2021-05-19 NOTE — Transfer of Care (Signed)
Immediate Anesthesia Transfer of Care Note  Patient: Nicole Shaw  Procedure(s) Performed: CATARACT EXTRACTION PHACO AND INTRAOCULAR LENS PLACEMENT (IOC) LEFT  DIABETIC VIVITY LENS 7.37 01:04.6 (Left: Eye)  Patient Location: PACU  Anesthesia Type: MAC  Level of Consciousness: awake, alert  and patient cooperative  Airway and Oxygen Therapy: Patient Spontanous Breathing and Patient connected to supplemental oxygen  Post-op Assessment: Post-op Vital signs reviewed, Patient's Cardiovascular Status Stable, Respiratory Function Stable, Patent Airway and No signs of Nausea or vomiting  Post-op Vital Signs: Reviewed and stable  Complications: No notable events documented.

## 2021-05-19 NOTE — Anesthesia Procedure Notes (Signed)
Procedure Name: MAC Date/Time: 05/19/2021 7:30 AM Performed by: Dionne Bucy, CRNA Pre-anesthesia Checklist: Patient identified, Emergency Drugs available, Suction available, Patient being monitored and Timeout performed Patient Re-evaluated:Patient Re-evaluated prior to induction Oxygen Delivery Method: Nasal cannula Placement Confirmation: positive ETCO2

## 2021-05-19 NOTE — Anesthesia Preprocedure Evaluation (Signed)
Anesthesia Evaluation  Patient identified by MRN, date of birth, ID band Patient awake    Reviewed: Allergy & Precautions, NPO status   Airway Mallampati: II  TM Distance: >3 FB     Dental   Pulmonary    Pulmonary exam normal        Cardiovascular hypertension,  Rhythm:Regular Rate:Normal     Neuro/Psych Anxiety    GI/Hepatic hiatal hernia, GERD  ,  Endo/Other  diabetes, Type 2Hypothyroidism Obesity - BMI > 30   Renal/GU      Musculoskeletal  (+) Fibromyalgia -  Abdominal   Peds  Hematology   Anesthesia Other Findings   Reproductive/Obstetrics                             Anesthesia Physical Anesthesia Plan  ASA: 3  Anesthesia Plan: MAC   Post-op Pain Management:    Induction: Intravenous  PONV Risk Score and Plan: TIVA, Midazolam and Treatment may vary due to age or medical condition  Airway Management Planned: Natural Airway and Nasal Cannula  Additional Equipment:   Intra-op Plan:   Post-operative Plan:   Informed Consent: I have reviewed the patients History and Physical, chart, labs and discussed the procedure including the risks, benefits and alternatives for the proposed anesthesia with the patient or authorized representative who has indicated his/her understanding and acceptance.       Plan Discussed with: CRNA  Anesthesia Plan Comments:         Anesthesia Quick Evaluation

## 2021-05-19 NOTE — H&P (Signed)
[place]   Primary Care Physician:  Rusty Aus, MD Ophthalmologist: Dr.Hildegard Hlavac  Pre-Procedure History & Physical: HPI:  Nicole Shaw is a 80 y.o. female here for cataract surgery.   Past Medical History:  Diagnosis Date   Anemia    Anxiety    Diabetes mellitus without complication (HCC)    Fibromyalgia    GERD (gastroesophageal reflux disease)    Hiatal hernia    Hypertension    Panic attacks     Past Surgical History:  Procedure Laterality Date   KNEE SURGERY     THYROIDECTOMY      Prior to Admission medications   Medication Sig Start Date End Date Taking? Authorizing Provider  aspirin EC 81 MG tablet Take 1 tablet by mouth daily.   Yes [provider]  Cholecalciferol (VITAMIN D3) 2000 units capsule Take 1 capsule by mouth daily.   Yes [provider]  clobetasol cream (TEMOVATE) [id number] % Apply 1 application topically 2 (two) times daily.   Yes [provider]  escitalopram (LEXAPRO) 5 MG tablet Take 5 mg by mouth daily.   Yes [provider]  famotidine (PEPCID) 40 MG tablet Take 40 mg by mouth at bedtime.   Yes [provider]  levothyroxine (SYNTHROID, LEVOTHROID) 100 MCG tablet Take 1 tablet by mouth daily. 04/19/18  Yes [provider]  metoprolol succinate (TOPROL-XL) 50 MG 24 hr tablet Take 1 tablet by mouth daily. Take 1 every morning and 1/2 qhs 05/09/18 05/19/21 Yes [provider]  pantoprazole (PROTONIX) 40 MG tablet Take 40 mg by mouth daily.   Yes [provider]  simvastatin (ZOCOR) 10 MG tablet Take 1 tablet by mouth daily. 04/19/18  Yes [provider]  telmisartan-hydrochlorothiazide (MICARDIS HCT) 80-12.5 MG tablet Take 1 tablet by mouth daily.   Yes [provider]    Allergies as of 02/04/2021 - Review Complete 10/18/2019  Allergen Reaction Noted   Ace inhibitors Other (See Comments) 02/09/2014   Penicillins  05/11/2018   Sulfa antibiotics  05/11/2018    Amlodipine Palpitations 04/14/2017    Family History  Problem Relation Age of Onset   Breast cancer Sister 10   Breast cancer Maternal Aunt     Social History   Socioeconomic History   Marital status: Single    Spouse name: Not on file   Number of children: Not on file   Years of education: Not on file   Highest education level: Not on file  Occupational History   Not on file  Tobacco Use   Smoking status: Never   Smokeless tobacco: Never  Substance and Sexual Activity   Alcohol use: Never   Drug use: Never   Sexual activity: Not on file  Other Topics Concern   Not on file  Social History Narrative   Not on file   Social Determinants of Health   Financial Resource Strain: Not on file  Food Insecurity: Not on file  Transportation Needs: Not on file  Physical Activity: Not on file  Stress: Not on file  Social Connections: Not on file  Intimate Partner Violence: Not on file    Review of Systems: See HPI, otherwise negative ROS  Physical Exam: BP (!) 195/76   Pulse 66   Temp (!) 97.5 F (36.4 C) (Temporal)   Ht '5\' 4"'$  (1.626 m)   Wt 79.6 kg   SpO2 99%   BMI 30.11 kg/m  General:   Alert, cooperative in NAD Head:  Normocephalic and atraumatic. Respiratory:  Normal work of breathing. Cardiovascular:  RRR  Impression/Plan: Nicole Shaw is here for cataract surgery.  Risks, benefits, limitations, and alternatives regarding cataract surgery have been reviewed with the patient.  Questions have been answered.  All parties agreeable.   Birder Robson, MD  05/19/2021, 7:22 AM

## 2021-05-19 NOTE — Op Note (Signed)
PREOPERATIVE DIAGNOSIS:  Nuclear sclerotic cataract of the left eye.   POSTOPERATIVE DIAGNOSIS:  Nuclear sclerotic cataract of the left eye.   OPERATIVE PROCEDURE:ORPROCALL@   SURGEON:  Birder Robson, MD.   ANESTHESIA:  Anesthesiologist: Veda Canning, MD CRNA: Dionne Bucy, CRNA  1.      Managed anesthesia care. 2.     0.67m of Shugarcaine was instilled following the paracentesis   COMPLICATIONS:  None.   TECHNIQUE:   Stop and chop   DESCRIPTION OF PROCEDURE:  The patient was examined and consented in the preoperative holding area where the aforementioned topical anesthesia was applied to the left eye and then brought back to the Operating Room where the left eye was prepped and draped in the usual sterile ophthalmic fashion and a lid speculum was placed. A paracentesis was created with the side port blade and the anterior chamber was filled with viscoelastic. A near clear corneal incision was performed with the steel keratome. A continuous curvilinear capsulorrhexis was performed with a cystotome followed by the capsulorrhexis forceps. Hydrodissection and hydrodelineation were carried out with BSS on a blunt cannula. The lens was removed in a stop and chop  technique and the remaining cortical material was removed with the irrigation-aspiration handpiece. The capsular bag was inflated with viscoelastic and the Technis [id number] lens was placed in the capsular bag without complication. The remaining viscoelastic was removed from the eye with the irrigation-aspiration handpiece. The wounds were hydrated. The anterior chamber was flushed with BSS and the eye was inflated to physiologic pressure. 0.175mVigamox was placed in the anterior chamber. The wounds were found to be water tight. The eye was dressed with Combigan. The patient was given protective glasses to wear throughout the day and a shield with which to sleep tonight. The patient was also given drops with which to begin a drop regimen  today and will follow-up with me in one day. Implant Name Type Inv. Item Serial No. Manufacturer Lot No. LRB No. Used Action  LENS IOL ACRYSOF VIVITY 16.5 - [id number] IOL ACRYSOF VIVITY 16.5 [id number]  Left 1 Implanted    Procedure(s): CATARACT EXTRACTION PHACO AND INTRAOCULAR LENS PLACEMENT (IOC) LEFT  DIABETIC VIVITY LENS 7.37 01:04.6 (Left)  Electronically signed: WiBirder Robson/26/2022 7:54 AM

## 2021-05-20 ENCOUNTER — Encounter: Payer: Self-pay | Admitting: Ophthalmology

## 2021-05-28 ENCOUNTER — Encounter: Payer: Self-pay | Admitting: Ophthalmology

## 2021-05-29 NOTE — Discharge Instructions (Signed)

## 2021-06-01 NOTE — Anesthesia Preprocedure Evaluation (Addendum)
Anesthesia Evaluation  Patient identified by MRN, date of birth, ID band Patient awake    Reviewed: Allergy & Precautions, NPO status , Patient's Chart, lab work & pertinent test results  History of Anesthesia Complications Negative for: history of anesthetic complications  Airway Mallampati: II   Neck ROM: Full    Dental no notable dental hx.    Pulmonary neg pulmonary ROS,    Pulmonary exam normal breath sounds clear to auscultation       Cardiovascular hypertension, Normal cardiovascular exam Rhythm:Regular Rate:Normal     Neuro/Psych PSYCHIATRIC DISORDERS (panic disorder) Anxiety    GI/Hepatic hiatal hernia, GERD  ,  Endo/Other  diabetes, Type 2Obesity   Renal/GU Renal disease (stage III CKD)     Musculoskeletal  (+) Fibromyalgia -  Abdominal   Peds  Hematology  (+) Blood dyscrasia, anemia ,   Anesthesia Other Findings   Reproductive/Obstetrics                           Anesthesia Physical Anesthesia Plan  ASA: 2  Anesthesia Plan: MAC   Post-op Pain Management:    Induction: Intravenous  PONV Risk Score and Plan: 2 and TIVA, Midazolam and Treatment may vary due to age or medical condition  Airway Management Planned: Nasal Cannula  Additional Equipment:   Intra-op Plan:   Post-operative Plan:   Informed Consent: I have reviewed the patients History and Physical, chart, labs and discussed the procedure including the risks, benefits and alternatives for the proposed anesthesia with the patient or authorized representative who has indicated his/her understanding and acceptance.       Plan Discussed with: CRNA  Anesthesia Plan Comments:        Anesthesia Quick Evaluation

## 2021-06-02 ENCOUNTER — Encounter
Admission: RE | Disposition: A | Discharge status: Home / Self Care | Payer: Self-pay | Source: Home / Self Care | Attending: Ophthalmology

## 2021-06-02 ENCOUNTER — Ambulatory Visit: Payer: Medicare Other | Admitting: Anesthesiology

## 2021-06-02 ENCOUNTER — Ambulatory Visit
Admission: RE | Admit: 2021-06-02 | Discharge: 2021-06-02 | Disposition: A | Discharge status: Home / Self Care | Payer: Medicare Other | Attending: Ophthalmology | Admitting: Ophthalmology

## 2021-06-02 ENCOUNTER — Other Ambulatory Visit: Payer: Self-pay

## 2021-06-02 ENCOUNTER — Encounter: Payer: Self-pay | Admitting: Ophthalmology

## 2021-06-02 DIAGNOSIS — H2512 Age-related nuclear cataract, left eye: Secondary | ICD-10-CM | POA: Diagnosis not present

## 2021-06-02 DIAGNOSIS — I129 Hypertensive chronic kidney disease with stage 1 through stage 4 chronic kidney disease, or unspecified chronic kidney disease: Secondary | ICD-10-CM | POA: Insufficient documentation

## 2021-06-02 DIAGNOSIS — N183 Chronic kidney disease, stage 3 unspecified: Secondary | ICD-10-CM | POA: Insufficient documentation

## 2021-06-02 DIAGNOSIS — Z79899 Other long term (current) drug therapy: Secondary | ICD-10-CM | POA: Diagnosis not present

## 2021-06-02 DIAGNOSIS — Z7982 Long term (current) use of aspirin: Secondary | ICD-10-CM | POA: Insufficient documentation

## 2021-06-02 DIAGNOSIS — E1136 Type 2 diabetes mellitus with diabetic cataract: Secondary | ICD-10-CM | POA: Insufficient documentation

## 2021-06-02 DIAGNOSIS — Z882 Allergy status to sulfonamides status: Secondary | ICD-10-CM | POA: Diagnosis not present

## 2021-06-02 DIAGNOSIS — Z880 Allergy status to penicillin: Secondary | ICD-10-CM | POA: Insufficient documentation

## 2021-06-02 DIAGNOSIS — E1122 Type 2 diabetes mellitus with diabetic chronic kidney disease: Secondary | ICD-10-CM | POA: Diagnosis not present

## 2021-06-02 DIAGNOSIS — Z79890 Hormone replacement therapy (postmenopausal): Secondary | ICD-10-CM | POA: Insufficient documentation

## 2021-06-02 DIAGNOSIS — Z888 Allergy status to other drugs, medicaments and biological substances status: Secondary | ICD-10-CM | POA: Insufficient documentation

## 2021-06-02 HISTORY — PX: CATARACT EXTRACTION W/PHACO: SHX586

## 2021-06-02 SURGERY — PHACOEMULSIFICATION, CATARACT, WITH IOL INSERTION
Anesthesia: Monitor Anesthesia Care | Site: Eye | Laterality: Right

## 2021-06-02 MED ORDER — PHENYLEPHRINE HCL 10 % OP SOLN
1.0000 [drp] | OPHTHALMIC | Status: AC
Start: 2021-06-02 — End: 2021-06-02
  Administered 2021-06-02 (×3): 1 [drp] via OPHTHALMIC

## 2021-06-02 MED ORDER — FENTANYL CITRATE (PF) 100 MCG/2ML IJ SOLN
INTRAMUSCULAR | Status: DC | PRN
  Administered 2021-06-02: 50 ug via INTRAVENOUS

## 2021-06-02 MED ORDER — MOXIFLOXACIN HCL 0.5 % OP SOLN
OPHTHALMIC | Status: DC | PRN
  Administered 2021-06-02: 0.2 mL via OPHTHALMIC

## 2021-06-02 MED ORDER — CYCLOPENTOLATE HCL 2 % OP SOLN
1.0000 [drp] | OPHTHALMIC | Status: AC
  Administered 2021-06-02 (×3): 1 [drp] via OPHTHALMIC

## 2021-06-02 MED ORDER — TETRACAINE HCL 0.5 % OP SOLN
1.0000 [drp] | OPHTHALMIC | Status: DC | PRN
  Administered 2021-06-02 (×3): 1 [drp] via OPHTHALMIC

## 2021-06-02 MED ORDER — SIGHTPATH DOSE#1 BSS IO SOLN
INTRAOCULAR | Status: DC | PRN
  Administered 2021-06-02: 59 mL via OPHTHALMIC

## 2021-06-02 MED ORDER — MIDAZOLAM HCL 2 MG/2ML IJ SOLN
INTRAMUSCULAR | Status: DC | PRN
  Administered 2021-06-02: 1 mg via INTRAVENOUS

## 2021-06-02 MED ORDER — SIGHTPATH DOSE#1 BSS IO SOLN
INTRAOCULAR | Status: DC | PRN
  Administered 2021-06-02: 2 mL

## 2021-06-02 MED ORDER — LACTATED RINGERS IV SOLN: INTRAVENOUS | Status: DC

## 2021-06-02 MED ORDER — SIGHTPATH DOSE#1 BSS IO SOLN
INTRAOCULAR | Status: DC | PRN
  Administered 2021-06-02: 15 mL via INTRAOCULAR

## 2021-06-02 MED ORDER — BRIMONIDINE TARTRATE-TIMOLOL 0.2-0.5 % OP SOLN
OPHTHALMIC | Status: DC | PRN
  Administered 2021-06-02: 1 [drp] via OPHTHALMIC

## 2021-06-02 MED ORDER — SIGHTPATH DOSE#1 NA CHONDROIT SULF-NA HYALURON 40-17 MG/ML IO SOLN
INTRAOCULAR | Status: DC | PRN
  Administered 2021-06-02: 1 mL via INTRAOCULAR

## 2021-06-02 SURGICAL SUPPLY — 15 items
CANNULA ANT/CHMB 27GA (MISCELLANEOUS) ×4 IMPLANT
GLOVE SURG ENC TEXT LTX SZ8 (GLOVE) ×2 IMPLANT
GLOVE SURG TRIUMPH 8.0 PF LTX (GLOVE) ×2 IMPLANT
GOWN STRL REUS W/ TWL LRG LVL3 (GOWN DISPOSABLE) ×2 IMPLANT
GOWN STRL REUS W/TWL LRG LVL3 (GOWN DISPOSABLE) ×4
LENS IOL ACRSF IQ VT 15 14.0 ×1 IMPLANT
LENS IOL ACRYSOF VIVITY 14.0 ×2 IMPLANT
MARKER SKIN DUAL TIP RULER LAB (MISCELLANEOUS) ×2 IMPLANT
NEEDLE FILTER BLUNT 18X 1/2SAF (NEEDLE) ×1
NEEDLE FILTER BLUNT 18X1 1/2 (NEEDLE) ×1 IMPLANT
PACK EYE AFTER SURG (MISCELLANEOUS) ×2 IMPLANT
SYR 3ML LL SCALE MARK (SYRINGE) ×2 IMPLANT
SYR TB 1ML LUER SLIP (SYRINGE) ×2 IMPLANT
WATER STERILE IRR 250ML POUR (IV SOLUTION) ×2 IMPLANT
WIPE NON LINTING 3.25X3.25 (MISCELLANEOUS) ×2 IMPLANT

## 2021-06-02 NOTE — Transfer of Care (Signed)
Immediate Anesthesia Transfer of Care Note  Patient: Nicole Shaw  Procedure(s) Performed: CATARACT EXTRACTION PHACO AND INTRAOCULAR LENS PLACEMENT (IOC) RIGHT DIABETIC VIVITY LENS 10.95 01:06.0 (Right: Eye)  Patient Location: PACU  Anesthesia Type: MAC  Level of Consciousness: awake, alert  and patient cooperative  Airway and Oxygen Therapy: Patient Spontanous Breathing and Patient connected to supplemental oxygen  Post-op Assessment: Post-op Vital signs reviewed, Patient's Cardiovascular Status Stable, Respiratory Function Stable, Patent Airway and No signs of Nausea or vomiting  Post-op Vital Signs: Reviewed and stable  Complications: No notable events documented.

## 2021-06-02 NOTE — Op Note (Signed)
PREOPERATIVE DIAGNOSIS:  Nuclear sclerotic cataract of the left eye.   POSTOPERATIVE DIAGNOSIS:  Nuclear sclerotic cataract of the left eye.   OPERATIVE PROCEDURE:ORPROCALL@   SURGEON:  Birder Robson, MD.   ANESTHESIA:  Anesthesiologist: Darrin Nipper, MD CRNA: Cameron Ali, CRNA  1.      Managed anesthesia care. 2.     0.45m of Shugarcaine was instilled following the paracentesis   COMPLICATIONS:  None.   TECHNIQUE:   Stop and chop   DESCRIPTION OF PROCEDURE:  The patient was examined and consented in the preoperative holding area where the aforementioned topical anesthesia was applied to the left eye and then brought back to the Operating Room where the left eye was prepped and draped in the usual sterile ophthalmic fashion and a lid speculum was placed. A paracentesis was created with the side port blade and the anterior chamber was filled with viscoelastic. A near clear corneal incision was performed with the steel keratome. A continuous curvilinear capsulorrhexis was performed with a cystotome followed by the capsulorrhexis forceps. Hydrodissection and hydrodelineation were carried out with BSS on a blunt cannula. The lens was removed in a stop and chop  technique and the remaining cortical material was removed with the irrigation-aspiration handpiece. The capsular bag was inflated with viscoelastic and the Technis [id number] lens was placed in the capsular bag without complication. The remaining viscoelastic was removed from the eye with the irrigation-aspiration handpiece. The wounds were hydrated. The anterior chamber was flushed with BSS and the eye was inflated to physiologic pressure. 0.123mVigamox was placed in the anterior chamber. The wounds were found to be water tight. The eye was dressed with Combigan. The patient was given protective glasses to wear throughout the day and a shield with which to sleep tonight. The patient was also given drops with which to begin a drop regimen  today and will follow-up with me in one day. Implant Name Type Inv. Item Serial No. Manufacturer Lot No. LRB No. Used Action  AcrySof IQ Vivity 14.0 D Intraocular Lens  [id number]  Right 1 Implanted    Procedure(s): CATARACT EXTRACTION PHACO AND INTRAOCULAR LENS PLACEMENT (IOC) RIGHT DIABETIC VIVITY LENS 10.95 01:06.0 (Right)  Electronically signed: WiBirder Robson/06/2021 9:36 AM

## 2021-06-02 NOTE — H&P (Signed)
[place]   Primary Care Physician:  Rusty Aus, MD Ophthalmologist: Dr. George Ina  Pre-Procedure History & Physical: HPI:  Nicole Shaw is a 80 y.o. female here for cataract surgery.   Past Medical History:  Diagnosis Date   Anemia    Anxiety    Diabetes mellitus without complication (HCC)    Fibromyalgia    GERD (gastroesophageal reflux disease)    Hiatal hernia    Hypertension    Panic attacks     Past Surgical History:  Procedure Laterality Date   CATARACT EXTRACTION W/PHACO Left 05/19/2021   Procedure: CATARACT EXTRACTION PHACO AND INTRAOCULAR LENS PLACEMENT ([place]) LEFT  DIABETIC VIVITY LENS 7.37 01:04.6;  Surgeon: Birder Robson, MD;  Location: [facility];  Service: Ophthalmology;  Laterality: Left;   KNEE SURGERY     THYROIDECTOMY      Prior to Admission medications   Medication Sig Start Date End Date Taking? Authorizing Provider  aspirin EC 81 MG tablet Take 1 tablet by mouth daily.   Yes [provider]  Cholecalciferol (VITAMIN D3) 2000 units capsule Take 1 capsule by mouth daily.   Yes [provider]  clobetasol cream (TEMOVATE) [id number] % Apply 1 application topically 2 (two) times daily.   Yes [provider]  escitalopram (LEXAPRO) 5 MG tablet Take 5 mg by mouth daily.   Yes [provider]  famotidine (PEPCID) 40 MG tablet Take 40 mg by mouth at bedtime.   Yes [provider]  levothyroxine (SYNTHROID, LEVOTHROID) 100 MCG tablet Take 1 tablet by mouth daily. 04/19/18  Yes [provider]  metoprolol succinate (TOPROL-XL) 50 MG 24 hr tablet Take 1 tablet by mouth daily. Take 1 every morning and 1/2 qhs 05/09/18 06/02/21 Yes [provider]  pantoprazole (PROTONIX) 40 MG tablet Take 40 mg by mouth daily.   Yes [provider]  simvastatin (ZOCOR) 10 MG tablet Take 1 tablet by mouth daily. 04/19/18  Yes [provider]  telmisartan-hydrochlorothiazide (MICARDIS HCT)  80-12.5 MG tablet Take 1 tablet by mouth daily.   Yes [provider]    Allergies as of 02/04/2021 - Review Complete 10/18/2019  Allergen Reaction Noted   Ace inhibitors Other (See Comments) 02/09/2014   Penicillins  05/11/2018   Sulfa antibiotics  05/11/2018   Amlodipine Palpitations 04/14/2017    Family History  Problem Relation Age of Onset   Breast cancer Sister 46   Breast cancer Maternal Aunt     Social History   Socioeconomic History   Marital status: Single    Spouse name: Not on file   Number of children: Not on file   Years of education: Not on file   Highest education level: Not on file  Occupational History   Not on file  Tobacco Use   Smoking status: Never   Smokeless tobacco: Never  Substance and Sexual Activity   Alcohol use: Never   Drug use: Never   Sexual activity: Not on file  Other Topics Concern   Not on file  Social History Narrative   Not on file   Social Determinants of Health   Financial Resource Strain: Not on file  Food Insecurity: Not on file  Transportation Needs: Not on file  Physical Activity: Not on file  Stress: Not on file  Social Connections: Not on file  Intimate Partner Violence: Not on file    Review of Systems: See HPI, otherwise negative ROS  Physical Exam: BP (!) 183/67  Pulse 63   Temp 98.1 F (36.7 C) (Temporal)   Wt 78.9 kg   SpO2 100%   BMI 29.87 kg/m  General:   Alert, cooperative in NAD Head:  Normocephalic and atraumatic. Respiratory:  Normal work of breathing. Cardiovascular:  RRR  Impression/Plan: Nicole Shaw is here for cataract surgery.  Risks, benefits, limitations, and alternatives regarding cataract surgery have been reviewed with the patient.  Questions have been answered.  All parties agreeable.   Birder Robson, MD  06/02/2021, 9:08 AM

## 2021-06-02 NOTE — Anesthesia Procedure Notes (Signed)
Procedure Name: MAC Date/Time: 06/02/2021 9:15 AM Performed by: Cameron Ali, CRNA Pre-anesthesia Checklist: Patient identified, Emergency Drugs available, Suction available, Timeout performed and Patient being monitored Patient Re-evaluated:Patient Re-evaluated prior to induction Oxygen Delivery Method: Nasal cannula Placement Confirmation: positive ETCO2

## 2021-06-02 NOTE — Anesthesia Postprocedure Evaluation (Signed)
Anesthesia Post Note  Patient: Nicole Shaw  Procedure(s) Performed: CATARACT EXTRACTION PHACO AND INTRAOCULAR LENS PLACEMENT ([facility]) RIGHT DIABETIC VIVITY LENS 10.95 01:06.0 (Right: Eye)     Patient location during evaluation: PACU Anesthesia Type: MAC Level of consciousness: awake and alert, oriented and patient cooperative Pain management: pain level controlled Vital Signs Assessment: post-procedure vital signs reviewed and stable Respiratory status: spontaneous breathing, nonlabored ventilation and respiratory function stable Cardiovascular status: blood pressure returned to baseline and stable Postop Assessment: adequate PO intake Anesthetic complications: no   No notable events documented.  Darrin Nipper

## 2021-06-03 ENCOUNTER — Encounter: Payer: Self-pay | Admitting: Ophthalmology

## 2021-07-20 ENCOUNTER — Other Ambulatory Visit: Payer: Self-pay | Admitting: Internal Medicine

## 2021-07-20 DIAGNOSIS — Z1231 Encounter for screening mammogram for malignant neoplasm of breast: Secondary | ICD-10-CM

## 2021-08-27 ENCOUNTER — Ambulatory Visit
Admission: RE | Admit: 2021-08-27 | Discharge: 2021-08-27 | Disposition: A | Discharge status: Home / Self Care | Payer: Medicare Other | Source: Ambulatory Visit | Attending: Internal Medicine | Admitting: Internal Medicine

## 2021-08-27 ENCOUNTER — Other Ambulatory Visit: Payer: Self-pay

## 2021-08-27 DIAGNOSIS — Z1231 Encounter for screening mammogram for malignant neoplasm of breast: Secondary | ICD-10-CM | POA: Insufficient documentation

## 2021-09-10 DIAGNOSIS — Z85820 Personal history of malignant melanoma of skin: Secondary | ICD-10-CM | POA: Insufficient documentation

## 2021-11-05 DIAGNOSIS — C4371 Malignant melanoma of right lower limb, including hip: Secondary | ICD-10-CM | POA: Insufficient documentation

## 2021-11-11 ENCOUNTER — Encounter: Payer: Self-pay | Admitting: *Deleted

## 2021-11-17 ENCOUNTER — Inpatient Hospital Stay: Payer: Medicare Other

## 2021-11-17 ENCOUNTER — Other Ambulatory Visit: Payer: Self-pay

## 2021-11-17 ENCOUNTER — Inpatient Hospital Stay: Payer: Medicare Other | Attending: Internal Medicine | Admitting: Internal Medicine

## 2021-11-17 ENCOUNTER — Encounter: Payer: Self-pay | Admitting: Internal Medicine

## 2021-11-17 DIAGNOSIS — Z85820 Personal history of malignant melanoma of skin: Secondary | ICD-10-CM | POA: Insufficient documentation

## 2021-11-17 DIAGNOSIS — F419 Anxiety disorder, unspecified: Secondary | ICD-10-CM | POA: Insufficient documentation

## 2021-11-17 DIAGNOSIS — Z7982 Long term (current) use of aspirin: Secondary | ICD-10-CM | POA: Insufficient documentation

## 2021-11-17 DIAGNOSIS — Z79899 Other long term (current) drug therapy: Secondary | ICD-10-CM | POA: Diagnosis not present

## 2021-11-17 DIAGNOSIS — D649 Anemia, unspecified: Secondary | ICD-10-CM | POA: Insufficient documentation

## 2021-11-17 DIAGNOSIS — N183 Chronic kidney disease, stage 3 unspecified: Secondary | ICD-10-CM | POA: Diagnosis not present

## 2021-11-17 LAB — CBC WITH DIFFERENTIAL/PLATELET
Abs Immature Granulocytes: 0.09 10*3/uL — ABNORMAL HIGH (ref 0.00–0.07)
Basophils Absolute: 0.1 10*3/uL (ref 0.0–0.1)
Basophils Relative: 1 %
Eosinophils Absolute: 0.1 10*3/uL (ref 0.0–0.5)
Eosinophils Relative: 1 %
HCT: 32.2 % — ABNORMAL LOW (ref 36.0–46.0)
Hemoglobin: 10.6 g/dL — ABNORMAL LOW (ref 12.0–15.0)
Immature Granulocytes: 1 %
Lymphocytes Relative: 18 %
Lymphs Abs: 1.7 10*3/uL (ref 0.7–4.0)
MCH: 30.9 pg (ref 26.0–34.0)
MCHC: 32.9 g/dL (ref 30.0–36.0)
MCV: 93.9 fL (ref 80.0–100.0)
Monocytes Absolute: 1.1 10*3/uL — ABNORMAL HIGH (ref 0.1–1.0)
Monocytes Relative: 11 %
Neutro Abs: 6.5 10*3/uL (ref 1.7–7.7)
Neutrophils Relative %: 68 %
Platelets: 237 10*3/uL (ref 150–400)
RBC: 3.43 MIL/uL — ABNORMAL LOW (ref 3.87–5.11)
RDW: 13.2 % (ref 11.5–15.5)
WBC: 9.6 10*3/uL (ref 4.0–10.5)
nRBC: 0 % (ref 0.0–0.2)

## 2021-11-17 LAB — COMPREHENSIVE METABOLIC PANEL
ALT: 20 U/L (ref 0–44)
AST: 23 U/L (ref 15–41)
Albumin: 4.2 g/dL (ref 3.5–5.0)
Alkaline Phosphatase: 63 U/L (ref 38–126)
Anion gap: 8 (ref 5–15)
BUN: 29 mg/dL — ABNORMAL HIGH (ref 8–23)
CO2: 24 mmol/L (ref 22–32)
Calcium: 9.4 mg/dL (ref 8.9–10.3)
Chloride: 97 mmol/L — ABNORMAL LOW (ref 98–111)
Creatinine, Ser: 1.43 mg/dL — ABNORMAL HIGH (ref 0.44–1.00)
GFR, Estimated: 37 mL/min — ABNORMAL LOW (ref >60)
Glucose, Bld: 101 mg/dL — ABNORMAL HIGH (ref 70–99)
Potassium: 4.1 mmol/L (ref 3.5–5.1)
Sodium: 129 mmol/L — ABNORMAL LOW (ref 135–145)
Total Bilirubin: 0.5 mg/dL (ref 0.3–1.2)
Total Protein: 7.2 g/dL (ref 6.5–8.1)

## 2021-11-17 LAB — RETICULOCYTES
Immature Retic Fract: 9.5 % (ref 2.3–15.9)
RBC.: 3.41 MIL/uL — ABNORMAL LOW (ref 3.87–5.11)
Retic Count, Absolute: 46 10*3/uL (ref 19.0–186.0)
Retic Ct Pct: 1.4 % (ref 0.4–3.1)

## 2021-11-17 LAB — TECHNOLOGIST SMEAR REVIEW: Plt Morphology: ADEQUATE

## 2021-11-17 LAB — LACTATE DEHYDROGENASE: LDH: 158 U/L (ref 98–192)

## 2021-11-17 LAB — FERRITIN: Ferritin: 58 ng/mL (ref 11–307)

## 2021-11-17 NOTE — Progress Notes (Signed)
Patient here for initial oncology appointment, concerns of fatigue and constipation

## 2021-11-17 NOTE — Progress Notes (Signed)
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  Ambulating independently. Nicole Shaw 81 y.o.  female has been referred to [place] for further evaluation/work-up for anemia.  Patient states to be anemic all her life.  However more recently noted to have worsening anemia hemoglobin around 9.   Blood in stools: None Change in bowel habits- None Blood in urine: None Difficulty swallowing: None Abnormal weight loss: None Iron supplementation: iron pill in the past; appx 1-2 years ago.  Prior Blood transfusions: none Bariatric surgery: None EGD/Colonoscopy:2010 [Dr.Skulskie]  Vaginal bleeding: None  Review of Systems  Constitutional:  Positive for malaise/fatigue. Negative for chills, diaphoresis, fever and weight loss.  HENT:  Negative for nosebleeds and sore throat.   Eyes:  Negative for double vision.  Respiratory:  Negative for cough, hemoptysis, sputum production, shortness of breath and wheezing.   Cardiovascular:  Negative for chest pain, palpitations, orthopnea and leg swelling.  Gastrointestinal:  Negative for abdominal pain, blood in stool, constipation, diarrhea, heartburn, melena, nausea and vomiting.  Genitourinary:  Negative for dysuria, frequency and urgency.  Musculoskeletal:  Positive for back pain and joint pain.  Skin: Negative.  Negative for itching and rash.  Neurological:  Negative for dizziness, tingling, focal weakness, weakness and headaches.  Endo/Heme/Allergies:  Does not bruise/bleed easily.  Psychiatric/Behavioral:  Negative for  depression. The patient is not nervous/anxious and does not have insomnia.    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)    Panic attacks    Pernicious anemia     SURGICAL HISTORY: Past Surgical History:  Procedure Laterality Date   CATARACT EXTRACTION W/PHACO Left 05/19/2021   Procedure: CATARACT EXTRACTION PHACO AND INTRAOCULAR LENS PLACEMENT ([place]) LEFT  DIABETIC VIVITY LENS 7.37 01:04.6;  Surgeon: Birder Robson, MD;  Location: [facility];  Service: Ophthalmology;  Laterality: Left;   CATARACT EXTRACTION W/PHACO Right 06/02/2021   Procedure: CATARACT EXTRACTION PHACO AND INTRAOCULAR LENS PLACEMENT ([facility]) RIGHT DIABETIC VIVITY LENS 10.95 01:06.0;  Surgeon: Birder Robson, MD;  Location: [facility];  Service: Ophthalmology;  Laterality: Right;   KNEE SURGERY     THYROIDECTOMY      SOCIAL HISTORY: Social History   Socioeconomic History   Marital status: Single    Spouse name: Not on file   Number of children: Not on file   Years of education: Not on file   Highest education level: Not on file  Occupational History   Not on file  Tobacco Use   Smoking status: Never    Passive exposure: Never   Smokeless tobacco: Never  Vaping Use  Vaping Use: Never used  Substance and Sexual Activity   Alcohol use: Never   Drug use: Never   Sexual activity: Not on file  Other Topics Concern   Not on file  Social History Narrative   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    Social  Determinants of Health   Financial Resource Strain: Not on file  Food Insecurity: Not on file  Transportation Needs: Not on file  Physical Activity: Not on file  Stress: Not on file  Social Connections: Not on file  Intimate Partner Violence: Not on file    FAMILY HISTORY: Family History  Problem Relation Age of Onset   Diabetes Mother    Heart attack Mother    Osteoporosis Mother    Stroke Mother    Ulcers Father    COPD Father    Breast cancer Sister 75   Diabetes Sister    Breast cancer Maternal Aunt     ALLERGIES:  is allergic to ace inhibitors, penicillins, sulfa antibiotics, trazodone, and amlodipine.  MEDICATIONS:  Current Outpatient Medications  Medication Sig Dispense Refill   aspirin 81 MG EC tablet Take by mouth.     aspirin EC 81 MG tablet Take 1 tablet by mouth daily.     Cholecalciferol (VITAMIN D3) 2000 units capsule Take 1 capsule by mouth daily.     clobetasol cream (TEMOVATE) 2.22 % Apply 1 application topically 2 (two) times daily.     DOCUSATE SODIUM PO Take by mouth.     escitalopram (LEXAPRO) 5 MG tablet Take 5 mg by mouth daily.     famotidine (PEPCID) 40 MG tablet Take 40 mg by mouth at bedtime.     famotidine (PEPCID) 40 MG tablet Take 1 tablet by mouth at bedtime.     furosemide (LASIX) 20 MG tablet Take 20 mg by mouth daily.     levothyroxine (SYNTHROID) 100 MCG tablet Take by mouth.     levothyroxine (SYNTHROID, LEVOTHROID) 100 MCG tablet Take 1 tablet by mouth daily.     loratadine (CLARITIN) 10 MG tablet Take 1 tablet by mouth daily.     pantoprazole (PROTONIX) 40 MG tablet Take 40 mg by mouth daily.     simvastatin (ZOCOR) 10 MG tablet Take 1 tablet by mouth daily.     simvastatin (ZOCOR) 10 MG tablet Take 1 tablet by mouth at bedtime.     telmisartan-hydrochlorothiazide (MICARDIS HCT) 80-12.5 MG tablet Take 1 tablet by mouth daily.     metoprolol succinate (TOPROL-XL) 50 MG 24 hr tablet Take 1 tablet by mouth daily. Take 1 every morning  and 1/2 qhs     No current facility-administered medications for this visit.      PHYSICAL EXAMINATION:   Vitals:   11/17/21 1352  BP: (!) 158/63  Pulse: 66  Resp: 17  Temp: 98.1 F (36.7 C)  SpO2: 99%   Filed Weights   11/17/21 1352  Weight: 173 lb (78.5 kg)    Physical Exam Vitals and nursing note reviewed.  HENT:     Head: Normocephalic and atraumatic.     Mouth/Throat:     Pharynx: Oropharynx is clear.  Eyes:     Extraocular Movements: Extraocular movements intact.     Pupils: Pupils are equal, round, and reactive to light.  Cardiovascular:     Rate and Rhythm: Normal rate and regular rhythm.  Pulmonary:     Comments: Decreased breath sounds bilaterally.  Abdominal:     Palpations: Abdomen  is soft.  Musculoskeletal:        General: Normal range of motion.     Cervical back: Normal range of motion.  Skin:    General: Skin is warm.  Neurological:     General: No focal deficit present.     Mental Status: She is alert and oriented to person, place, and time.  Psychiatric:        Behavior: Behavior normal.        Judgment: Judgment normal.    LABORATORY DATA:  I have reviewed the data as listed Lab Results  Component Value Date   WBC 9.3 10/18/2019   HGB 11.1 (L) 10/18/2019   HCT 33.7 (L) 10/18/2019   MCV 89.2 10/18/2019   PLT 229 10/18/2019   No results for input(s): NA, K, CL, CO2, GLUCOSE, BUN, CREATININE, CALCIUM, GFRNONAA, GFRAA, PROT, ALBUMIN, AST, ALT, ALKPHOS, BILITOT, BILIDIR, IBILI in the last 8760 hours.   No results found.  Symptomatic anemia #Chronic mild anemia recent worsening Jan 2023-hemoglobin 9.6.  MCV normal.  Normal white blood/platelets.  #I had a Long discussion with the patient  regarding multiple etiologies of anemia including iron deficiency/nutritional deficiency /chronic kidney disease chronic inflammation.  Also discussed possibility of primary bone marrow disorders which would need a bone marrow biopsy to confirm any  malignant causes.  However hold off bone marrow biopsy while pending work-up below.   # Check CBC CMP LDH CRP haptoglobin; iron studies/ferritin; myeloma panel kappa lambda light chain; review of peripheral smear; erythropoietin level.   # B12 def [id number]; PCP]-on B12 injections.  Recent B12 level normal limits.  # CKD stage III-GFR 40s.  Clinically stable.  Thank you Dr.Miller  for allowing me to participate in the care of your pleasant patient. Please do not hesitate to contact me with questions or concerns in the interim.  # DISPOSITION: # labs today # follow up in 2-3 weeks- MD; No labs- Dr.B     All questions were answered. The patient knows to call the clinic with any problems, questions or concerns.      Cammie Sickle, MD 11/17/2021 2:37 PM

## 2021-11-17 NOTE — Assessment & Plan Note (Addendum)
#  Chronic mild anemia recent worsening Jan 2023-hemoglobin 9.6.  MCV normal.  Normal white blood/platelets.  #I had a Long discussion with the patient  regarding multiple etiologies of anemia including iron deficiency/nutritional deficiency /chronic kidney disease chronic inflammation.  Also discussed possibility of primary bone marrow disorders which would need a bone marrow biopsy to confirm any malignant causes.  However hold off bone marrow biopsy while pending work-up below.   # Check CBC CMP LDH CRP haptoglobin; iron studies/ferritin; myeloma panel kappa lambda light chain; review of peripheral smear; erythropoietin level.   # B12 def [id number]; PCP]-on B12 injections.  Recent B12 level normal limits.  # CKD stage III-GFR 40s.  Clinically stable.  Thank you Dr.Miller  for allowing me to participate in the care of your pleasant patient. Please do not hesitate to contact me with questions or concerns in the interim.  # DISPOSITION: # labs today # follow up in 2-3 weeks- MD; No labs- Dr.B

## 2021-11-18 LAB — KAPPA/LAMBDA LIGHT CHAINS
Kappa free light chain: 28.8 mg/L — ABNORMAL HIGH (ref 3.3–19.4)
Kappa, lambda light chain ratio: 1.7 — ABNORMAL HIGH (ref 0.26–1.65)
Lambda free light chains: 16.9 mg/L (ref 5.7–26.3)

## 2021-11-18 LAB — HAPTOGLOBIN: Haptoglobin: 199 mg/dL (ref 41–333)

## 2021-11-18 LAB — ERYTHROPOIETIN: Erythropoietin: 7.6 m[IU]/mL (ref 2.6–18.5)

## 2021-11-23 LAB — MULTIPLE MYELOMA PANEL, SERUM
Albumin SerPl Elph-Mcnc: 3.7 g/dL (ref 2.9–4.4)
Albumin/Glob SerPl: 1.4 (ref 0.7–1.7)
Alpha 1: 0.3 g/dL (ref 0.0–0.4)
Alpha2 Glob SerPl Elph-Mcnc: 0.8 g/dL (ref 0.4–1.0)
B-Globulin SerPl Elph-Mcnc: 1.1 g/dL (ref 0.7–1.3)
Gamma Glob SerPl Elph-Mcnc: 0.6 g/dL (ref 0.4–1.8)
Globulin, Total: 2.8 g/dL (ref 2.2–3.9)
IgA: 193 mg/dL (ref 64–422)
IgG (Immunoglobin G), Serum: 557 mg/dL — ABNORMAL LOW (ref 586–1602)
IgM (Immunoglobulin M), Srm: 97 mg/dL (ref 26–217)
Total Protein ELP: 6.5 g/dL (ref 6.0–8.5)

## 2021-12-10 ENCOUNTER — Inpatient Hospital Stay: Payer: Medicare Other | Attending: Internal Medicine | Admitting: Internal Medicine

## 2021-12-10 ENCOUNTER — Other Ambulatory Visit: Payer: Self-pay

## 2021-12-10 ENCOUNTER — Encounter: Payer: Self-pay | Admitting: Internal Medicine

## 2021-12-10 DIAGNOSIS — N183 Chronic kidney disease, stage 3 unspecified: Secondary | ICD-10-CM | POA: Diagnosis not present

## 2021-12-10 DIAGNOSIS — D649 Anemia, unspecified: Secondary | ICD-10-CM | POA: Diagnosis present

## 2021-12-10 DIAGNOSIS — Z7982 Long term (current) use of aspirin: Secondary | ICD-10-CM | POA: Insufficient documentation

## 2021-12-10 DIAGNOSIS — Z79899 Other long term (current) drug therapy: Secondary | ICD-10-CM | POA: Diagnosis not present

## 2021-12-10 DIAGNOSIS — R5383 Other fatigue: Secondary | ICD-10-CM | POA: Diagnosis not present

## 2021-12-10 DIAGNOSIS — F410 Panic disorder [episodic paroxysmal anxiety] without agoraphobia: Secondary | ICD-10-CM | POA: Diagnosis not present

## 2021-12-10 DIAGNOSIS — Z85820 Personal history of malignant melanoma of skin: Secondary | ICD-10-CM | POA: Diagnosis not present

## 2021-12-10 DIAGNOSIS — E871 Hypo-osmolality and hyponatremia: Secondary | ICD-10-CM | POA: Insufficient documentation

## 2021-12-10 DIAGNOSIS — F419 Anxiety disorder, unspecified: Secondary | ICD-10-CM | POA: Diagnosis not present

## 2021-12-10 NOTE — Progress Notes (Signed)
Patient is scheduled for MOES procedure at [facility] with Dr. Lacinda Axon for Melanoma of right little toe, procedure on 12/22/21

## 2021-12-10 NOTE — Assessment & Plan Note (Addendum)
#  Chronic mild anemia recent worsening Jan 2023-hemoglobin 9-10- ? [PO iron not worked; constipation 4 different types].  Recommend IV iron. Discussed the potential acute infusion reactions with IV iron; which are quite rare.  Patient understands the risk; will proceed with infusions.  Also discussed use of erythropoietin stimulating agents like Aranesp to stimulate the bone marrow; the goal hematocrit is 33/hemoglobin 30.  However patient wants to wait until after her foot surgery end of Feb, 2023.   #Etiology of anemia- ?  CKD versus others.  Kappa lambda light chain ratio slightly abnormal-likely secondary insufficiency; M protein negative.  Clinically not suggestive of multiple myeloma.  Would not recommend a bone marrow biopsy at this time.  # CKD-stage III/chronic Hyponatremia: - ?  Etiology prn lasix 20 mg/ twice a week [Dr.Miller]  # B12 def [2019; PCP]-on B12 injections.  Recent B12 level normal limits.  # Toe surgery in [place]: feb 28th- at [facility].  We will start infusions thereafter.  Labs printed # DISPOSITION: # start in March 1st week- venofer weekly x3;  # follow up in end of april MD; lab- cbc/bmp;iron studies/ferritin-  possible venofer-- Dr.B

## 2021-12-10 NOTE — Progress Notes (Signed)
[name]  Patient Care Team: Rusty Aus, MD as PCP - General (Internal Medicine) Cammie Sickle, MD as Consulting Physician (Hematology)  CHIEF COMPLAINTS/PURPOSE OF CONSULTATION: ANEMIA  HEMATOLOGY HISTORY:  # CHRONIC ANEMIA [2019- Hb-10]; JAN 2023- 9.6; GFR ~40s; EGD-; colonoscopy-2010 [normal; Dr.Skulskie];  CKD versus others.  Kappa lambda light chain ratio slightly abnormal-likely secondary insufficiency; M protein negative.  Clinically not suggestive of multiple myeloma.  Would not recommend a bone marrow biopsy at this time.  # CKD-stage III/chronic Hyponatremia: - ?  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  Ambulating independently.  Nicole Shaw 81 y.o.  female with chronic anemia recently worse is here for follow-up/review labs.  Patient continues to have mild to moderate fatigue.  Denies any blood in stools black or stools.  No nausea no vomiting.  No fever no chills.  She is currently awaiting foot surgery at [facility] end of the month.  Review of Systems  Constitutional:  Positive for malaise/fatigue. Negative for chills, diaphoresis, fever and weight loss.  HENT:  Negative for nosebleeds and sore throat.   Eyes:  Negative for double vision.  Respiratory:  Negative for cough, hemoptysis, sputum production, shortness of breath and wheezing.   Cardiovascular:  Negative for chest pain, palpitations, orthopnea and leg swelling.  Gastrointestinal:  Negative for abdominal pain, blood in stool, constipation, diarrhea, heartburn, melena, nausea and vomiting.  Genitourinary:  Negative for dysuria, frequency and urgency.  Musculoskeletal:  Positive for back pain and joint pain.  Skin: Negative.  Negative for itching and rash.  Neurological:  Negative for dizziness, tingling,  focal weakness, weakness and headaches.  Endo/Heme/Allergies:  Does not bruise/bleed easily.  Psychiatric/Behavioral:  Negative for depression. The patient is not nervous/anxious and does not have insomnia.    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)    Panic attacks    Pernicious anemia     SURGICAL HISTORY: Past Surgical History:  Procedure Laterality Date   CATARACT EXTRACTION W/PHACO Left 05/19/2021   Procedure: CATARACT EXTRACTION PHACO AND INTRAOCULAR LENS PLACEMENT ([place]) LEFT  DIABETIC VIVITY LENS 7.37 01:04.6;  Surgeon: Birder Robson, MD;  Location: [facility];  Service: Ophthalmology;  Laterality: Left;   CATARACT EXTRACTION W/PHACO Right 06/02/2021   Procedure: CATARACT EXTRACTION PHACO AND INTRAOCULAR LENS PLACEMENT ([facility]) RIGHT DIABETIC VIVITY LENS 10.95 01:06.0;  Surgeon: Birder Robson, MD;  Location: [facility];  Service: Ophthalmology;  Laterality: Right;   KNEE SURGERY     THYROIDECTOMY      SOCIAL HISTORY: Social History   Socioeconomic History   Marital status: Single    Spouse name: Not on file   Number of children: Not on file   Years of education: Not on file   Highest education level: Not on file  Occupational  History   Not on file  Tobacco Use   Smoking status: Never    Passive exposure: Never   Smokeless tobacco: Never  Vaping Use   Vaping Use: Never used  Substance and Sexual Activity   Alcohol use: Never   Drug use: Never   Sexual activity: Not on file   Other Topics Concern   Not on file  Social History Narrative   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    Social Determinants of Health   Financial Resource Strain: Not on file  Food Insecurity: Not on file  Transportation Needs: Not on file  Physical Activity: Not on file  Stress: Not on file  Social Connections: Not on file  Intimate Partner Violence: Not on file    FAMILY HISTORY: Family History  Problem Relation Age of Onset   Diabetes Mother    Heart attack Mother    Osteoporosis Mother    Stroke Mother    Ulcers Father    COPD Father    Breast cancer Sister 74   Diabetes Sister    Breast cancer Maternal Aunt     ALLERGIES:  is allergic to ace inhibitors, penicillins, sulfa antibiotics, trazodone, and amlodipine.  MEDICATIONS:  Current Outpatient Medications  Medication Sig Dispense Refill   aspirin 81 MG EC tablet Take by mouth.     aspirin EC 81 MG tablet Take 1 tablet by mouth daily.     Cholecalciferol (VITAMIN D3) 2000 units capsule Take 1 capsule by mouth daily.     clobetasol cream (TEMOVATE) 6.00 % Apply 1 application topically 2 (two) times daily.     Cyanocobalamin (VITAMIN B-12 IJ) Inject as directed every 30 (thirty) days.     DOCUSATE SODIUM PO Take by mouth.     escitalopram (LEXAPRO) 5 MG tablet Take 5 mg by mouth daily.     famotidine (PEPCID) 40 MG tablet Take 40 mg by mouth at bedtime.     famotidine (PEPCID) 40 MG tablet Take 1 tablet by mouth at bedtime.     furosemide (LASIX) 20 MG tablet Take 20 mg by mouth daily as needed.     levothyroxine (SYNTHROID) 100 MCG tablet Take by mouth.     levothyroxine (SYNTHROID, LEVOTHROID) 100 MCG tablet Take 1 tablet by mouth daily.     loratadine (CLARITIN) 10 MG tablet Take 1 tablet by mouth daily.     pantoprazole (PROTONIX) 40 MG tablet Take 40 mg by mouth daily.     simvastatin (ZOCOR) 10 MG tablet Take 1 tablet by mouth daily.     simvastatin (ZOCOR) 10 MG tablet Take 1 tablet  by mouth at bedtime.     telmisartan-hydrochlorothiazide (MICARDIS HCT) 80-12.5 MG tablet Take 1 tablet by mouth daily.     metoprolol succinate (TOPROL-XL) 50 MG 24 hr tablet Take 1 tablet by mouth daily. Take 1 every morning and 1/2 qhs     No current facility-administered medications for this visit.      PHYSICAL EXAMINATION:   Vitals:   12/10/21 1100  BP: (!) 150/66  Pulse: 62  Resp: 18  Temp: (!) 97.2 F (36.2 C)   Filed Weights   12/10/21 1100  Weight: 176 lb 12.8 oz (80.2 kg)    Physical Exam Vitals and nursing note reviewed.  HENT:     Head: Normocephalic and atraumatic.     Mouth/Throat:     Pharynx: Oropharynx is clear.  Eyes:     Extraocular Movements: Extraocular movements  intact.     Pupils: Pupils are equal, round, and reactive to light.  Cardiovascular:     Rate and Rhythm: Normal rate and regular rhythm.  Pulmonary:     Comments: Decreased breath sounds bilaterally.  Abdominal:     Palpations: Abdomen is soft.  Musculoskeletal:        General: Normal range of motion.     Cervical back: Normal range of motion.  Skin:    General: Skin is warm.  Neurological:     General: No focal deficit present.     Mental Status: She is alert and oriented to person, place, and time.  Psychiatric:        Behavior: Behavior normal.        Judgment: Judgment normal.    LABORATORY DATA:  I have reviewed the data as listed Lab Results  Component Value Date   WBC 9.6 11/17/2021   HGB 10.6 (L) 11/17/2021   HCT 32.2 (L) 11/17/2021   MCV 93.9 11/17/2021   PLT 237 11/17/2021   Recent Labs    11/17/21 1436  NA 129*  K 4.1  CL 97*  CO2 24  GLUCOSE 101*  BUN 29*  CREATININE 1.43*  CALCIUM 9.4  GFRNONAA 37*  PROT 7.2  ALBUMIN 4.2  AST 23  ALT 20  ALKPHOS 63  BILITOT 0.5     No results found.  Symptomatic anemia # Chronic mild anemia recent worsening Jan 2023-hemoglobin 9-10- ? [PO iron not worked; constipation 4 different types].  Recommend IV  iron. Discussed the potential acute infusion reactions with IV iron; which are quite rare.  Patient understands the risk; will proceed with infusions.  Also discussed use of erythropoietin stimulating agents like Aranesp to stimulate the bone marrow; the goal hematocrit is 33/hemoglobin 30.  However patient wants to wait until after her foot surgery end of Feb, 2023.   #Etiology of anemia- ?  CKD versus others.  Kappa lambda light chain ratio slightly abnormal-likely secondary insufficiency; M protein negative.  Clinically not suggestive of multiple myeloma.  Would not recommend a bone marrow biopsy at this time.  # CKD-stage III/chronic Hyponatremia: - ?  Etiology prn lasix 20 mg/ twice a week [Dr.Miller]  # B12 def [2019; PCP]-on B12 injections.  Recent B12 level normal limits.  # Toe surgery in [place]: feb 28th- at [facility].  We will start infusions thereafter.  Labs printed # DISPOSITION: # start in March 1st week- venofer weekly x3;  # follow up in end of april MD; lab- cbc/bmp;iron studies/ferritin-  possible venofer-- Dr.B     All questions were answered. The patient knows to call the clinic with any problems, questions or concerns.      Cammie Sickle, MD 12/10/2021 7:10 PM

## 2021-12-24 ENCOUNTER — Other Ambulatory Visit: Payer: Self-pay

## 2021-12-24 ENCOUNTER — Inpatient Hospital Stay: Payer: Medicare Other | Attending: Internal Medicine

## 2021-12-24 VITALS — BP 152/59 | HR 72 | Temp 97.9°F | Resp 18

## 2021-12-24 DIAGNOSIS — D649 Anemia, unspecified: Secondary | ICD-10-CM | POA: Diagnosis not present

## 2021-12-24 MED ORDER — IRON SUCROSE 20 MG/ML IV SOLN
200.0000 mg | Freq: Once | INTRAVENOUS | Status: AC
  Administered 2021-12-24: 200 mg via INTRAVENOUS
  Filled 2021-12-24: qty 10

## 2021-12-24 MED ORDER — SODIUM CHLORIDE 0.9 % IV SOLN: 200.0000 mg | Freq: Once | INTRAVENOUS | Status: DC

## 2021-12-24 MED ORDER — SODIUM CHLORIDE 0.9 % IV SOLN
Freq: Once | INTRAVENOUS | Status: AC
  Filled 2021-12-24: qty 250

## 2021-12-24 NOTE — Patient Instructions (Signed)
[facility]  Discharge Instructions: ?Thank you for choosing [facility] to provide your oncology and hematology care.  ?If you have a lab appointment with the Cancer Center, please go directly to the Cancer Center and check in at the registration area. ? ?Wear comfortable clothing and clothing appropriate for easy access to any Portacath or PICC line.  ? ?We strive to give you quality time with your provider. You may need to reschedule your appointment if you arrive late (15 or more minutes).  Arriving late affects you and other patients whose appointments are after yours.  Also, if you miss three or more appointments without notifying the office, you may be dismissed from the clinic at the provider?s discretion.    ?  ?For prescription refill requests, have your pharmacy contact our office and allow 72 hours for refills to be completed.   ? ?Today you received the following chemotherapy and/or immunotherapy agents VENOFER    ?  ?To help prevent nausea and vomiting after your treatment, we encourage you to take your nausea medication as directed. ? ?BELOW ARE SYMPTOMS THAT SHOULD BE REPORTED IMMEDIATELY: ?*FEVER GREATER THAN 100.4 F (38 ?C) OR HIGHER ?*CHILLS OR SWEATING ?*NAUSEA AND VOMITING THAT IS NOT CONTROLLED WITH YOUR NAUSEA MEDICATION ?*UNUSUAL SHORTNESS OF BREATH ?*UNUSUAL BRUISING OR BLEEDING ?*URINARY PROBLEMS (pain or burning when urinating, or frequent urination) ?*BOWEL PROBLEMS (unusual diarrhea, constipation, pain near the anus) ?TENDERNESS IN MOUTH AND THROAT WITH OR WITHOUT PRESENCE OF ULCERS (sore throat, sores in mouth, or a toothache) ?UNUSUAL RASH, SWELLING OR PAIN  ?UNUSUAL VAGINAL DISCHARGE OR ITCHING  ? ?Items with * indicate a potential emergency and should be followed up as soon as possible or go to the Emergency Department if any problems should occur. ? ?Please show the CHEMOTHERAPY ALERT CARD or IMMUNOTHERAPY ALERT CARD at check-in to the  Emergency Department and triage nurse. ? ?Should you have questions after your visit or need to cancel or reschedule your appointment, please contact [facility]  [phone] and follow the prompts.  Office hours are 8:00 a.m. to 4:30 p.m. Monday - Friday. Please note that voicemails left after 4:00 p.m. may not be returned until the following business day.  We are closed weekends and major holidays. You have access to a nurse at all times for urgent questions. Please call the main number to the clinic [phone] and follow the prompts. ? ?For any non-urgent questions, you may also contact your provider using MyChart. We now offer e-Visits for anyone 18 and older to request care online for non-urgent symptoms. For details visit [email or web address]. ?  ?Also download the MyChart app! Go to the app store, search "MyChart", open the app, select , and log in with your MyChart username and password. ? ?Due to Covid, a mask is required upon entering the hospital/clinic. If you do not have a mask, one will be given to you upon arrival. For doctor visits, patients may have 1 support person aged 18 or older with them. For treatment visits, patients cannot have anyone with them due to current Covid guidelines and our immunocompromised population.  ? ?Iron Sucrose Injection ?What is this medication? ?IRON SUCROSE (EYE ern SOO krose) treats low levels of iron (iron deficiency anemia) in people with kidney disease. Iron is a mineral that plays an important role in making red blood cells, which carry oxygen from your lungs to the rest of your body. ?This medicine may   be used for other purposes; ask your health care provider or pharmacist if you have questions. ?COMMON BRAND NAME(S): Venofer ?What should I tell my care team before I take this medication? ?They need to know if you have any of these conditions: ?Anemia not caused by low iron levels ?Heart disease ?High levels of  iron in the blood ?Kidney disease ?Liver disease ?An unusual or allergic reaction to iron, other medications, foods, dyes, or preservatives ?Pregnant or trying to get pregnant ?Breast-feeding ?How should I use this medication? ?This medication is for infusion into a vein. It is given in a hospital or clinic setting. ?Talk to your care team about the use of this medication in children. While this medication may be prescribed for children as young as 2 years for selected conditions, precautions do apply. ?Overdosage: If you think you have taken too much of this medicine contact a poison control center or emergency room at once. ?NOTE: This medicine is only for you. Do not share this medicine with others. ?What if I miss a dose? ?It is important not to miss your dose. Call your care team if you are unable to keep an appointment. ?What may interact with this medication? ?Do not take this medication with any of the following: ?Deferoxamine ?Dimercaprol ?Other iron products ?This medication may also interact with the following: ?Chloramphenicol ?Deferasirox ?This list may not describe all possible interactions. Give your health care provider a list of all the medicines, herbs, non-prescription drugs, or dietary supplements you use. Also tell them if you smoke, drink alcohol, or use illegal drugs. Some items may interact with your medicine. ?What should I watch for while using this medication? ?Visit your care team regularly. Tell your care team if your symptoms do not start to get better or if they get worse. You may need blood work done while you are taking this medication. ?You may need to follow a special diet. Talk to your care team. Foods that contain iron include: whole grains/cereals, dried fruits, beans, or peas, leafy green vegetables, and organ meats (liver, kidney). ?What side effects may I notice from receiving this medication? ?Side effects that you should report to your care team as soon as  possible: ?Allergic reactions--skin rash, itching, hives, swelling of the face, lips, tongue, or throat ?Low blood pressure--dizziness, feeling faint or lightheaded, blurry vision ?Shortness of breath ?Side effects that usually do not require medical attention (report to your care team if they continue or are bothersome): ?Flushing ?Headache ?Joint pain ?Muscle pain ?Nausea ?Pain, redness, or irritation at injection site ?This list may not describe all possible side effects. Call your doctor for medical advice about side effects. You may report side effects to FDA at 1-800-FDA-1088. ?Where should I keep my medication? ?This medication is given in a hospital or clinic and will not be stored at home. ?NOTE: This sheet is a summary. It may not cover all possible information. If you have questions about this medicine, talk to your doctor, pharmacist, or health care provider. ?? 2022 Elsevier/Gold Standard (2021-03-06 00:00:00) ? ?

## 2021-12-31 ENCOUNTER — Other Ambulatory Visit: Payer: Self-pay

## 2021-12-31 ENCOUNTER — Inpatient Hospital Stay: Payer: Medicare Other

## 2021-12-31 VITALS — BP 161/63 | HR 69 | Temp 98.6°F | Resp 20

## 2021-12-31 DIAGNOSIS — D649 Anemia, unspecified: Secondary | ICD-10-CM | POA: Diagnosis not present

## 2021-12-31 MED ORDER — IRON SUCROSE 20 MG/ML IV SOLN
200.0000 mg | Freq: Once | INTRAVENOUS | Status: AC
  Administered 2021-12-31: 15:00:00 200 mg via INTRAVENOUS
  Filled 2021-12-31: qty 10

## 2021-12-31 MED ORDER — SODIUM CHLORIDE 0.9 % IV SOLN
Freq: Once | INTRAVENOUS | Status: AC
  Filled 2021-12-31: qty 250

## 2021-12-31 MED ORDER — SODIUM CHLORIDE 0.9 % IV SOLN: 200.0000 mg | Freq: Once | INTRAVENOUS | Status: DC

## 2022-01-07 ENCOUNTER — Inpatient Hospital Stay: Payer: Medicare Other

## 2022-01-07 ENCOUNTER — Other Ambulatory Visit: Payer: Self-pay

## 2022-01-07 VITALS — BP 156/61 | HR 64 | Temp 97.1°F | Resp 18

## 2022-01-07 DIAGNOSIS — D649 Anemia, unspecified: Secondary | ICD-10-CM | POA: Diagnosis not present

## 2022-01-07 MED ORDER — IRON SUCROSE 20 MG/ML IV SOLN
200.0000 mg | Freq: Once | INTRAVENOUS | Status: AC
  Administered 2022-01-07: 200 mg via INTRAVENOUS
  Filled 2022-01-07: qty 10

## 2022-01-07 MED ORDER — SODIUM CHLORIDE 0.9 % IV SOLN: 200.0000 mg | Freq: Once | INTRAVENOUS | Status: DC

## 2022-01-07 MED ORDER — SODIUM CHLORIDE 0.9 % IV SOLN
Freq: Once | INTRAVENOUS | Status: AC
  Filled 2022-01-07: qty 250

## 2022-01-07 NOTE — Patient Instructions (Signed)
[facility]  Discharge Instructions: ?Thank you for choosing [facility] to provide your oncology and hematology care.  ?If you have a lab appointment with the Cancer Center, please go directly to the Cancer Center and check in at the registration area. ? ?Wear comfortable clothing and clothing appropriate for easy access to any Portacath or PICC line.  ? ?We strive to give you quality time with your provider. You may need to reschedule your appointment if you arrive late (15 or more minutes).  Arriving late affects you and other patients whose appointments are after yours.  Also, if you miss three or more appointments without notifying the office, you may be dismissed from the clinic at the provider?s discretion.    ?  ?For prescription refill requests, have your pharmacy contact our office and allow 72 hours for refills to be completed.   ? ?Today you received the following chemotherapy and/or immunotherapy agents VENOFER    ?  ?To help prevent nausea and vomiting after your treatment, we encourage you to take your nausea medication as directed. ? ?BELOW ARE SYMPTOMS THAT SHOULD BE REPORTED IMMEDIATELY: ?*FEVER GREATER THAN 100.4 F (38 ?C) OR HIGHER ?*CHILLS OR SWEATING ?*NAUSEA AND VOMITING THAT IS NOT CONTROLLED WITH YOUR NAUSEA MEDICATION ?*UNUSUAL SHORTNESS OF BREATH ?*UNUSUAL BRUISING OR BLEEDING ?*URINARY PROBLEMS (pain or burning when urinating, or frequent urination) ?*BOWEL PROBLEMS (unusual diarrhea, constipation, pain near the anus) ?TENDERNESS IN MOUTH AND THROAT WITH OR WITHOUT PRESENCE OF ULCERS (sore throat, sores in mouth, or a toothache) ?UNUSUAL RASH, SWELLING OR PAIN  ?UNUSUAL VAGINAL DISCHARGE OR ITCHING  ? ?Items with * indicate a potential emergency and should be followed up as soon as possible or go to the Emergency Department if any problems should occur. ? ?Please show the CHEMOTHERAPY ALERT CARD or IMMUNOTHERAPY ALERT CARD at check-in to the  Emergency Department and triage nurse. ? ?Should you have questions after your visit or need to cancel or reschedule your appointment, please contact [facility]  [phone] and follow the prompts.  Office hours are 8:00 a.m. to 4:30 p.m. Monday - Friday. Please note that voicemails left after 4:00 p.m. may not be returned until the following business day.  We are closed weekends and major holidays. You have access to a nurse at all times for urgent questions. Please call the main number to the clinic [phone] and follow the prompts. ? ?For any non-urgent questions, you may also contact your provider using MyChart. We now offer e-Visits for anyone 18 and older to request care online for non-urgent symptoms. For details visit [email or web address]. ?  ?Also download the MyChart app! Go to the app store, search "MyChart", open the app, select [facility], and log in with your MyChart username and password. ? ?Due to Covid, a mask is required upon entering the hospital/clinic. If you do not have a mask, one will be given to you upon arrival. For doctor visits, patients may have 1 support person aged 18 or older with them. For treatment visits, patients cannot have anyone with them due to current Covid guidelines and our immunocompromised population.  ? ?Iron Sucrose Injection ?What is this medication? ?IRON SUCROSE (EYE ern SOO krose) treats low levels of iron (iron deficiency anemia) in people with kidney disease. Iron is a mineral that plays an important role in making red blood cells, which carry oxygen from your lungs to the rest of your body. ?This medicine may   be used for other purposes; ask your health care provider or pharmacist if you have questions. ?COMMON BRAND NAME(S): Venofer ?What should I tell my care team before I take this medication? ?They need to know if you have any of these conditions: ?Anemia not caused by low iron levels ?Heart disease ?High levels of  iron in the blood ?Kidney disease ?Liver disease ?An unusual or allergic reaction to iron, other medications, foods, dyes, or preservatives ?Pregnant or trying to get pregnant ?Breast-feeding ?How should I use this medication? ?This medication is for infusion into a vein. It is given in a hospital or clinic setting. ?Talk to your care team about the use of this medication in children. While this medication may be prescribed for children as young as 2 years for selected conditions, precautions do apply. ?Overdosage: If you think you have taken too much of this medicine contact a poison control center or emergency room at once. ?NOTE: This medicine is only for you. Do not share this medicine with others. ?What if I miss a dose? ?It is important not to miss your dose. Call your care team if you are unable to keep an appointment. ?What may interact with this medication? ?Do not take this medication with any of the following: ?Deferoxamine ?Dimercaprol ?Other iron products ?This medication may also interact with the following: ?Chloramphenicol ?Deferasirox ?This list may not describe all possible interactions. Give your health care provider a list of all the medicines, herbs, non-prescription drugs, or dietary supplements you use. Also tell them if you smoke, drink alcohol, or use illegal drugs. Some items may interact with your medicine. ?What should I watch for while using this medication? ?Visit your care team regularly. Tell your care team if your symptoms do not start to get better or if they get worse. You may need blood work done while you are taking this medication. ?You may need to follow a special diet. Talk to your care team. Foods that contain iron include: whole grains/cereals, dried fruits, beans, or peas, leafy green vegetables, and organ meats (liver, kidney). ?What side effects may I notice from receiving this medication? ?Side effects that you should report to your care team as soon as  possible: ?Allergic reactions--skin rash, itching, hives, swelling of the face, lips, tongue, or throat ?Low blood pressure--dizziness, feeling faint or lightheaded, blurry vision ?Shortness of breath ?Side effects that usually do not require medical attention (report to your care team if they continue or are bothersome): ?Flushing ?Headache ?Joint pain ?Muscle pain ?Nausea ?Pain, redness, or irritation at injection site ?This list may not describe all possible side effects. Call your doctor for medical advice about side effects. You may report side effects to FDA at 1-800-FDA-1088. ?Where should I keep my medication? ?This medication is given in a hospital or clinic and will not be stored at home. ?NOTE: This sheet is a summary. It may not cover all possible information. If you have questions about this medicine, talk to your doctor, pharmacist, or health care provider. ?? 2022 Elsevier/Gold Standard (2021-03-06 00:00:00) ? ?

## 2022-02-18 ENCOUNTER — Ambulatory Visit: Payer: Medicare Other | Admitting: Internal Medicine

## 2022-02-18 ENCOUNTER — Other Ambulatory Visit: Payer: Medicare Other

## 2022-02-18 ENCOUNTER — Ambulatory Visit: Payer: Medicare Other

## 2022-02-23 ENCOUNTER — Inpatient Hospital Stay: Payer: Medicare Other

## 2022-02-23 ENCOUNTER — Inpatient Hospital Stay: Payer: Medicare Other | Attending: Internal Medicine | Admitting: Internal Medicine

## 2022-02-23 ENCOUNTER — Encounter: Payer: Self-pay | Admitting: Internal Medicine

## 2022-02-23 VITALS — BP 177/65 | HR 60 | Temp 97.6°F

## 2022-02-23 DIAGNOSIS — D538 Other specified nutritional anemias: Secondary | ICD-10-CM | POA: Insufficient documentation

## 2022-02-23 DIAGNOSIS — D649 Anemia, unspecified: Secondary | ICD-10-CM

## 2022-02-23 LAB — CBC WITH DIFFERENTIAL/PLATELET
Abs Immature Granulocytes: 0.11 10*3/uL — ABNORMAL HIGH (ref 0.00–0.07)
Basophils Absolute: 0 10*3/uL (ref 0.0–0.1)
Basophils Relative: 0 %
Eosinophils Absolute: 0 10*3/uL (ref 0.0–0.5)
Eosinophils Relative: 0 %
HCT: 35.5 % — ABNORMAL LOW (ref 36.0–46.0)
Hemoglobin: 11.5 g/dL — ABNORMAL LOW (ref 12.0–15.0)
Immature Granulocytes: 1 %
Lymphocytes Relative: 10 %
Lymphs Abs: 1.4 10*3/uL (ref 0.7–4.0)
MCH: 29.9 pg (ref 26.0–34.0)
MCHC: 32.4 g/dL (ref 30.0–36.0)
MCV: 92.2 fL (ref 80.0–100.0)
Monocytes Absolute: 1.6 10*3/uL — ABNORMAL HIGH (ref 0.1–1.0)
Monocytes Relative: 12 %
Neutro Abs: 10.8 10*3/uL — ABNORMAL HIGH (ref 1.7–7.7)
Neutrophils Relative %: 77 %
Platelets: 212 10*3/uL (ref 150–400)
RBC: 3.85 MIL/uL — ABNORMAL LOW (ref 3.87–5.11)
RDW: 13.1 % (ref 11.5–15.5)
WBC: 13.9 10*3/uL — ABNORMAL HIGH (ref 4.0–10.5)
nRBC: 0 % (ref 0.0–0.2)

## 2022-02-23 LAB — BASIC METABOLIC PANEL
Anion gap: 5 (ref 5–15)
BUN: 37 mg/dL — ABNORMAL HIGH (ref 8–23)
CO2: 24 mmol/L (ref 22–32)
Calcium: 8.8 mg/dL — ABNORMAL LOW (ref 8.9–10.3)
Chloride: 99 mmol/L (ref 98–111)
Creatinine, Ser: 1.41 mg/dL — ABNORMAL HIGH (ref 0.44–1.00)
GFR, Estimated: 37 mL/min — ABNORMAL LOW (ref >60)
Glucose, Bld: 112 mg/dL — ABNORMAL HIGH (ref 70–99)
Potassium: 4.4 mmol/L (ref 3.5–5.1)
Sodium: 128 mmol/L — ABNORMAL LOW (ref 135–145)

## 2022-02-23 LAB — IRON AND TIBC
Iron: 101 ug/dL (ref 28–170)
Saturation Ratios: 33 % — ABNORMAL HIGH (ref 10.4–31.8)
TIBC: 311 ug/dL (ref 250–450)
UIBC: 210 ug/dL

## 2022-02-23 LAB — FERRITIN: Ferritin: 296 ng/mL (ref 11–307)

## 2022-02-23 MED ORDER — SODIUM CHLORIDE 0.9 % IV SOLN
Freq: Once | INTRAVENOUS | Status: AC
  Filled 2022-02-23: qty 250

## 2022-02-23 MED ORDER — SODIUM CHLORIDE 0.9 % IV SOLN: 200.0000 mg | Freq: Once | INTRAVENOUS | Status: DC

## 2022-02-23 MED ORDER — IRON SUCROSE 20 MG/ML IV SOLN
200.0000 mg | Freq: Once | INTRAVENOUS | Status: AC
  Administered 2022-02-23: 200 mg via INTRAVENOUS
  Filled 2022-02-23: qty 10

## 2022-02-23 NOTE — Patient Instructions (Signed)

## 2022-02-23 NOTE — Assessment & Plan Note (Addendum)
#   Chronic mild anemia recent worsening Jan 2023-hemoglobin 9-10; not on oral iron.  Poor tolerance. ? ? # S/p 3 IV venofer- Hb improved to 11; proceed with venofer today.  ? ?#Etiology of anemia- ?  CKD versus others.  Kappa lambda light chain ratio slightly abnormal-likely secondary insufficiency; M protein negative.  ? ?# CKD-stage III/chronic Hyponatremia: - ?  Etiology- recommend gatorade; avoid free water.  Avoid motrin. [? Rib pain- 1 a day] ? ?# B12 def [id number]; PCP]-on B12 injections.  Recent B12 level normal limits. ? ?Labs printed ?# DISPOSITION: ?# venofer today ?# follow up in 4 monthsl MD; lab- cbc/bmp;iron studies/ferritin-  possible venofer-- Dr.B ?

## 2022-02-23 NOTE — Progress Notes (Signed)
[place] ?CONSULT NOTE ? ?Patient Care Team: ?Rusty Aus, MD as PCP - General (Internal Medicine) ?Cammie Sickle, MD as Consulting Physician (Hematology) ? ?CHIEF COMPLAINTS/PURPOSE OF CONSULTATION: ANEMIA ? ?HEMATOLOGY HISTORY: ? ?# CHRONIC ANEMIA [2019- Hb-10]; JAN 2023- 9.6; GFR ~40s; EGD-; colonoscopy-2010 [normal; Dr.Skulskie];  CKD versus others.  Kappa lambda light chain ratio slightly abnormal-likely secondary insufficiency; M protein negative.  Clinically not suggestive of multiple myeloma.  Would not recommend a bone marrow biopsy at this time. ? ?# CKD-stage III/chronic Hyponatremia: -[109 in past- [name]] ?  Etiology [leg swelling on -prn lasix 20 mg/ twice a week ;Dr.Miller] ? 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 ?HISTORY OF PRESENTING ILLNESS: Alone.  Ambulating independently. ? ?Nicole Shaw 81 y.o.  female with chronic anemia sec to CKD-III is here for a follow up. ? ?S/p Foot surgery- at [facility] uneventful. ? ?However patient also had a recent back injection steroid with PCP. ? ?S/p Ive venofer weekly x3- energy improved.  Mild fatigue. ? ?Review of Systems  ?Constitutional:  Positive for malaise/fatigue. Negative for chills, diaphoresis, fever and weight loss.  ?HENT:  Negative for nosebleeds and sore throat.   ?Eyes:  Negative for double vision.  ?Respiratory:  Negative for cough, hemoptysis, sputum production, shortness of breath and wheezing.   ?Cardiovascular:  Negative for chest pain, palpitations, orthopnea and leg swelling.  ?Gastrointestinal:  Negative for abdominal pain, blood in stool, constipation, diarrhea, heartburn, melena, nausea and vomiting.  ?Genitourinary:  Negative for dysuria, frequency and urgency.  ?Musculoskeletal:  Positive for back pain and joint pain.  ?Skin: Negative.  Negative for itching and rash.  ?Neurological:  Negative for dizziness, tingling, focal weakness,  weakness and headaches.  ?Endo/Heme/Allergies:  Does not bruise/bleed easily.  ?Psychiatric/Behavioral:  Negative for depression. The patient is not nervous/anxious and does not have insomnia.   ? ?MEDICAL HISTORY:  ?Past Medical History:  ?Diagnosis Date  ? Acquired hypothyroidism   ? Adult idiopathic generalized osteoporosis   ? Anemia   ? Anxiety   ? B12 deficiency   ? CKD (chronic kidney disease)   ? Diabetes mellitus without complication ([place])   ? Fibromyalgia   ? GERD (gastroesophageal reflux disease)   ? Hiatal hernia   ? Hyperlipidemia   ? Hypertension   ? Lichen sclerosus   ? Lumbosacral radiculitis   ? Major depressive disorder, recurrent, mild ([place])   ? 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 Panic attacks   ? Pernicious anemia   ? ? ?SURGICAL HISTORY: ?Past Surgical History:  ?Procedure Laterality Date  ? CATARACT EXTRACTION W/PHACO Left 05/19/2021  ? Procedure: CATARACT EXTRACTION PHACO AND INTRAOCULAR LENS PLACEMENT (IOC) LEFT  DIABETIC VIVITY LENS 7.37 01:04.6;  Surgeon: Birder Robson, MD;  Location: [place];  Service: Ophthalmology;  Laterality: Left;  ? CATARACT EXTRACTION W/PHACO Right 06/02/2021  ? Procedure: CATARACT EXTRACTION PHACO AND INTRAOCULAR LENS PLACEMENT (IOC) RIGHT DIABETIC VIVITY LENS 10.95 01:06.0;  Surgeon: Birder Robson, MD;  Location: [place];  Service: Ophthalmology;  Laterality: Right;  ? KNEE SURGERY    ? THYROIDECTOMY    ? ? ?SOCIAL HISTORY: ?Social History  ? ?Socioeconomic History  ? Marital status: Single  ?  Spouse name: Not on file  ? Number of children: Not on file  ? Years of education: Not on file  ? Highest education level: Not on file  ?Occupational History  ? Not on  file  ?Tobacco Use  ? Smoking status: Never  ?  Passive exposure: Never  ? Smokeless tobacco: Never  ?Vaping Use  ? Vaping Use: Never used  ?Substance and Sexual Activity  ? Alcohol use: Never  ? Drug use: Never  ? Sexual activity: Not on file  ?Other Topics Concern   ? Not on file  ?Social History Narrative  ? 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   ? ?Social Determinants of Health  ? ?Financial Resource Strain: Not on file  ?Food Insecurity: Not on file  ?Transportation Needs: Not on file  ?Physical Activity: Not on file  ?Stress: Not on file  ?Social Connections: Not on file  ?Intimate Partner Violence: Not on file  ? ? ?FAMILY HISTORY: ?Family History  ?Problem Relation Age of Onset  ? Diabetes Mother   ? Heart attack Mother   ? Osteoporosis Mother   ? Stroke Mother   ? Ulcers Father   ? COPD Father   ? Breast cancer Sister 30  ? Diabetes Sister   ? Breast cancer Maternal Aunt   ? ? ?ALLERGIES:  is allergic to ace inhibitors, penicillins, sulfa antibiotics, trazodone, and amlodipine. ? ?MEDICATIONS:  ?Current Outpatient Medications  ?Medication Sig Dispense Refill  ? aspirin 81 MG EC tablet Take by mouth.    ? Cholecalciferol (VITAMIN D3) 2000 units capsule Take 1 capsule by mouth daily.    ? clobetasol cream (TEMOVATE) 3.79 % Apply 1 application topically 2 (two) times daily.    ? Cyanocobalamin (VITAMIN B-12 IJ) Inject as directed every 30 (thirty) days.    ? DOCUSATE SODIUM PO Take by mouth.    ? escitalopram (LEXAPRO) 5 MG tablet Take 5 mg by mouth daily.    ? famotidine (PEPCID) 40 MG tablet Take 40 mg by mouth at bedtime.    ? famotidine (PEPCID) 40 MG tablet Take 1 tablet by mouth at bedtime.    ? furosemide (LASIX) 20 MG tablet Take 20 mg by mouth daily as needed.    ? levothyroxine (SYNTHROID) 100 MCG tablet Take by mouth.    ? levothyroxine (SYNTHROID, LEVOTHROID) 100 MCG tablet Take 1 tablet by mouth daily.    ? loratadine (CLARITIN) 10 MG tablet Take 1 tablet by mouth daily.    ? pantoprazole (PROTONIX) 40 MG tablet Take 40 mg by mouth daily.    ? simvastatin (ZOCOR) 10 MG tablet Take 1 tablet by mouth daily.    ? simvastatin (ZOCOR) 10 MG tablet Take 1 tablet by mouth at bedtime.    ? telmisartan-hydrochlorothiazide (MICARDIS HCT) 80-12.5 MG  tablet Take 1 tablet by mouth daily.    ? metoprolol succinate (TOPROL-XL) 50 MG 24 hr tablet Take 1 tablet by mouth daily. Take 1 every morning and 1/2 qhs    ? ?No current facility-administered medications for this visit.  ? ?Facility-Administered Medications Ordered in Other Visits  ?Medication Dose Route Frequency Provider Last Rate Last Admin  ? iron sucrose (VENOFER) injection 200 mg  200 mg Intravenous Once Cammie Sickle, MD      ? ? ? ? ?PHYSICAL EXAMINATION: ? ? ?Vitals:  ? 02/23/22 1304  ?BP: (!) 159/57  ?Pulse: 65  ?Temp: 98.6 ?F (37 ?C)  ?SpO2: 100%  ? ?Filed Weights  ? 02/23/22 1304  ?Weight: 171 lb (77.6 kg)  ? ? ?Physical Exam ?Vitals and nursing note reviewed.  ?HENT:  ?   Head: Normocephalic and atraumatic.  ?   Mouth/Throat:  ?  Pharynx: Oropharynx is clear.  ?Eyes:  ?   Extraocular Movements: Extraocular movements intact.  ?   Pupils: Pupils are equal, round, and reactive to light.  ?Cardiovascular:  ?   Rate and Rhythm: Normal rate and regular rhythm.  ?Pulmonary:  ?   Comments: Decreased breath sounds bilaterally.  ?Abdominal:  ?   Palpations: Abdomen is soft.  ?Musculoskeletal:     ?   General: Normal range of motion.  ?   Cervical back: Normal range of motion.  ?Skin: ?   General: Skin is warm.  ?Neurological:  ?   General: No focal deficit present.  ?   Mental Status: She is alert and oriented to person, place, and time.  ?Psychiatric:     ?   Behavior: Behavior normal.     ?   Judgment: Judgment normal.  ? ? ?LABORATORY DATA:  ?I have reviewed the data as listed ?Lab Results  ?Component Value Date  ? WBC 13.9 (H) 02/23/2022  ? HGB 11.5 (L) 02/23/2022  ? HCT 35.5 (L) 02/23/2022  ? MCV 92.2 02/23/2022  ? PLT 212 02/23/2022  ? ?Recent Labs  ?  11/17/21 ?1436 02/23/22 ?1235  ?NA 129* 128*  ?K 4.1 4.4  ?CL 97* 99  ?CO2 24 24  ?GLUCOSE 101* 112*  ?BUN 29* 37*  ?CREATININE 1.43* 1.41*  ?CALCIUM 9.4 8.8*  ?GFRNONAA 37* 37*  ?PROT 7.2  --   ?ALBUMIN 4.2  --   ?AST 23  --   ?ALT 20  --    ?ALKPHOS 63  --   ?BILITOT 0.5  --   ? ? ? ?No results found. ? ?Symptomatic anemia ?# Chronic mild anemia recent worsening Jan 2023-hemoglobin 9-10; not on oral iron.  Poor tolerance. ? ? # S/p 3 IV v

## 2022-06-25 ENCOUNTER — Ambulatory Visit: Payer: Medicare Other

## 2022-06-25 ENCOUNTER — Other Ambulatory Visit: Payer: Medicare Other

## 2022-06-25 ENCOUNTER — Ambulatory Visit: Payer: Medicare Other | Admitting: Internal Medicine

## 2022-06-25 ENCOUNTER — Ambulatory Visit: Payer: Medicare Other | Admitting: Medical Oncology

## 2022-07-02 ENCOUNTER — Encounter: Payer: Self-pay | Admitting: Medical Oncology

## 2022-07-02 ENCOUNTER — Inpatient Hospital Stay: Payer: Medicare Other | Attending: Internal Medicine | Admitting: Medical Oncology

## 2022-07-02 ENCOUNTER — Inpatient Hospital Stay: Payer: Medicare Other

## 2022-07-02 VITALS — BP 166/68 | HR 60 | Temp 98.7°F | Resp 20 | Wt 161.1 lb

## 2022-07-02 VITALS — BP 180/67 | HR 65

## 2022-07-02 DIAGNOSIS — D649 Anemia, unspecified: Secondary | ICD-10-CM | POA: Diagnosis not present

## 2022-07-02 DIAGNOSIS — N183 Chronic kidney disease, stage 3 unspecified: Secondary | ICD-10-CM | POA: Insufficient documentation

## 2022-07-02 DIAGNOSIS — D631 Anemia in chronic kidney disease: Secondary | ICD-10-CM | POA: Insufficient documentation

## 2022-07-02 LAB — BASIC METABOLIC PANEL
Anion gap: 5 (ref 5–15)
BUN: 29 mg/dL — ABNORMAL HIGH (ref 8–23)
CO2: 26 mmol/L (ref 22–32)
Calcium: 9.5 mg/dL (ref 8.9–10.3)
Chloride: 105 mmol/L (ref 98–111)
Creatinine, Ser: 1.31 mg/dL — ABNORMAL HIGH (ref 0.44–1.00)
GFR, Estimated: 41 mL/min — ABNORMAL LOW (ref >60)
Glucose, Bld: 103 mg/dL — ABNORMAL HIGH (ref 70–99)
Potassium: 4.5 mmol/L (ref 3.5–5.1)
Sodium: 136 mmol/L (ref 135–145)

## 2022-07-02 LAB — CBC WITH DIFFERENTIAL/PLATELET
Abs Immature Granulocytes: 0.05 10*3/uL (ref 0.00–0.07)
Basophils Absolute: 0 10*3/uL (ref 0.0–0.1)
Basophils Relative: 1 %
Eosinophils Absolute: 0.1 10*3/uL (ref 0.0–0.5)
Eosinophils Relative: 1 %
HCT: 32.6 % — ABNORMAL LOW (ref 36.0–46.0)
Hemoglobin: 10.7 g/dL — ABNORMAL LOW (ref 12.0–15.0)
Immature Granulocytes: 1 %
Lymphocytes Relative: 23 %
Lymphs Abs: 1.9 10*3/uL (ref 0.7–4.0)
MCH: 32 pg (ref 26.0–34.0)
MCHC: 32.8 g/dL (ref 30.0–36.0)
MCV: 97.6 fL (ref 80.0–100.0)
Monocytes Absolute: 0.9 10*3/uL (ref 0.1–1.0)
Monocytes Relative: 11 %
Neutro Abs: 5.1 10*3/uL (ref 1.7–7.7)
Neutrophils Relative %: 63 %
Platelets: 200 10*3/uL (ref 150–400)
RBC: 3.34 MIL/uL — ABNORMAL LOW (ref 3.87–5.11)
RDW: 12.3 % (ref 11.5–15.5)
WBC: 8.1 10*3/uL (ref 4.0–10.5)
nRBC: 0 % (ref 0.0–0.2)

## 2022-07-02 LAB — IRON AND TIBC
Iron: 75 ug/dL (ref 28–170)
Saturation Ratios: 23 % (ref 10.4–31.8)
TIBC: 330 ug/dL (ref 250–450)
UIBC: 255 ug/dL

## 2022-07-02 LAB — FERRITIN: Ferritin: 224 ng/mL (ref 11–307)

## 2022-07-02 MED ORDER — SODIUM CHLORIDE 0.9 % IV SOLN
200.0000 mg | Freq: Once | INTRAVENOUS | Status: AC
  Administered 2022-07-02: 200 mg via INTRAVENOUS
  Filled 2022-07-02: qty 200

## 2022-07-02 MED ORDER — SODIUM CHLORIDE 0.9 % IV SOLN
Freq: Once | INTRAVENOUS | Status: AC
  Filled 2022-07-02: qty 250

## 2022-07-02 NOTE — Progress Notes (Addendum)
[place] NOTE  Patient Care Team: Rusty Aus, MD as PCP - General (Internal Medicine) Cammie Sickle, MD as Consulting Physician (Hematology)  CHIEF COMPLAINTS/PURPOSE OF CONSULTATION: ANEMIA  HEMATOLOGY HISTORY:  # CHRONIC ANEMIA [2019- Hb-10]; JAN 2023- 9.6; GFR ~40s; EGD-; colonoscopy-2010 [normal; Dr.Skulskie];  CKD versus others.  Kappa lambda light chain ratio slightly abnormal-likely secondary insufficiency; M protein negative.  Clinically not suggestive of multiple myeloma.  Would not recommend a bone marrow biopsy at this time.  # CKD-stage III/chronic Hyponatremia: -[109 in past- [name]] ?  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  Ambulating independently.  Nicole Shaw 81 y.o.  female with chronic anemia sec to CKD-III is here for a follow up.  Patient reports that she is doing well. 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 Has some fatigue that she thinks is due to caregiving.   When she initially started with our office she received 3 doses of IV Venofer. Fatigue improved. Her last dose was in May and she only had one treatment. Not taking iron supplementation. NO bleeding episodes. UTD colonoscopy per pt.   Review of Systems  Constitutional:  Positive for malaise/fatigue. Negative for chills, diaphoresis, fever and weight loss.  HENT:  Negative for nosebleeds and sore throat.   Eyes:  Negative for double vision.  Respiratory:  Negative for cough, hemoptysis, sputum production, shortness of breath and wheezing.   Cardiovascular:  Negative for chest pain, palpitations, orthopnea and leg swelling.  Gastrointestinal:  Negative for abdominal pain, blood in stool, constipation, diarrhea, heartburn, melena, nausea and vomiting.  Genitourinary:  Negative for dysuria,  frequency and urgency.  Musculoskeletal:  Positive for back pain and joint pain.  Skin: Negative.  Negative for itching and rash.  Neurological:  Negative for dizziness, tingling, focal weakness, weakness and headaches.  Endo/Heme/Allergies:  Does not bruise/bleed easily.  Psychiatric/Behavioral:  Negative for depression. The patient is not nervous/anxious and does not have insomnia.     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)    Panic attacks    Pernicious anemia     SURGICAL HISTORY: Past Surgical History:  Procedure Laterality Date   CATARACT EXTRACTION W/PHACO Left 05/19/2021   Procedure: CATARACT EXTRACTION PHACO AND INTRAOCULAR LENS PLACEMENT ([place]) LEFT  DIABETIC VIVITY LENS 7.37 01:04.6;  Surgeon: Birder Robson, MD;  Location: [place];  Service: Ophthalmology;  Laterality: Left;   CATARACT EXTRACTION W/PHACO Right 06/02/2021   Procedure: CATARACT EXTRACTION PHACO AND INTRAOCULAR LENS PLACEMENT (IOC) RIGHT DIABETIC VIVITY LENS 10.95 01:06.0;  Surgeon: Birder Robson, MD;  Location: [place];  Service: Ophthalmology;  Laterality: Right;   KNEE SURGERY     THYROIDECTOMY      SOCIAL HISTORY: Social History   Socioeconomic History   Marital status: Single  Spouse name: Not on file   Number of children: Not on file   Years of education: Not on file   Highest education level: Not on file  Occupational History   Not on file  Tobacco Use   Smoking status: Never    Passive exposure:  Never   Smokeless tobacco: Never  Vaping Use   Vaping Use: Never used  Substance and Sexual Activity   Alcohol use: Never   Drug use: Never   Sexual activity: Not on file  Other Topics Concern   Not on file  Social History Narrative   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    Social Determinants of Health   Financial Resource Strain: Not on file  Food Insecurity: Not on file  Transportation Needs: Not on file  Physical Activity: Not on file  Stress: Not on file  Social Connections: Not on file  Intimate Partner Violence: Not on file    FAMILY HISTORY: Family History  Problem Relation Age of Onset   Diabetes Mother    Heart attack Mother    Osteoporosis Mother    Stroke Mother    Ulcers Father    COPD Father    Breast cancer Sister 72   Diabetes Sister    Breast cancer Maternal Aunt     ALLERGIES:  is allergic to ace inhibitors, penicillins, sulfa antibiotics, trazodone, and amlodipine.  MEDICATIONS:  Current Outpatient Medications  Medication Sig Dispense Refill   aspirin 81 MG EC tablet Take by mouth.     Cholecalciferol (VITAMIN D3) 2000 units capsule Take 1 capsule by mouth daily.     clobetasol cream (TEMOVATE) 0.35 % Apply 1 application topically 2 (two) times daily.     Cyanocobalamin (VITAMIN B-12 IJ) Inject as directed every 30 (thirty) days.     DOCUSATE SODIUM PO Take by mouth.     escitalopram (LEXAPRO) 5 MG tablet Take 5 mg by mouth daily.     famotidine (PEPCID) 40 MG tablet Take 40 mg by mouth at bedtime.     famotidine (PEPCID) 40 MG tablet Take 1 tablet by mouth at bedtime.     furosemide (LASIX) 20 MG tablet Take 20 mg by mouth daily as needed.     levothyroxine (SYNTHROID) 100 MCG tablet Take by mouth.     levothyroxine (SYNTHROID, LEVOTHROID) 100 MCG tablet Take 1 tablet by mouth daily.     loratadine (CLARITIN) 10 MG tablet Take 1 tablet by mouth daily.     metoprolol succinate (TOPROL-XL) 50 MG 24 hr tablet Take 1 tablet  by mouth daily. Take 1 every morning and 1/2 qhs     pantoprazole (PROTONIX) 40 MG tablet Take 40 mg by mouth daily.     simvastatin (ZOCOR) 10 MG tablet Take 1 tablet by mouth daily.     simvastatin (ZOCOR) 10 MG tablet Take 1 tablet by mouth at bedtime.     telmisartan-hydrochlorothiazide (MICARDIS HCT) 80-12.5 MG tablet Take 1 tablet by mouth daily.     No current facility-administered medications for this visit.      PHYSICAL EXAMINATION:   Vitals:   07/02/22 1412  BP: (!) 166/68  Pulse: 60  Resp: 20  Temp: 98.7 F (37.1 C)  SpO2: 99%   Filed Weights   07/02/22 1412  Weight: 161 lb 1.6 oz (73.1 kg)    Physical Exam Vitals and nursing note reviewed.  HENT:     Head: Normocephalic and atraumatic.     Mouth/Throat:  Pharynx: Oropharynx is clear.  Eyes:     Extraocular Movements: Extraocular movements intact.     Pupils: Pupils are equal, round, and reactive to light.  Cardiovascular:     Rate and Rhythm: Normal rate and regular rhythm.  Pulmonary:     Comments: Decreased breath sounds bilaterally.  Abdominal:     Palpations: Abdomen is soft.  Musculoskeletal:        General: Normal range of motion.     Cervical back: Normal range of motion.  Skin:    General: Skin is warm.  Neurological:     General: No focal deficit present.     Mental Status: She is alert and oriented to person, place, and time.  Psychiatric:        Behavior: Behavior normal.        Judgment: Judgment normal.     LABORATORY DATA:  I have reviewed the data as listed Lab Results  Component Value Date   WBC 8.1 07/02/2022   HGB 10.7 (L) 07/02/2022   HCT 32.6 (L) 07/02/2022   MCV 97.6 07/02/2022   PLT 200 07/02/2022   Recent Labs    11/17/21 1436 02/23/22 1235 07/02/22 1338  NA 129* 128* 136  K 4.1 4.4 4.5  CL 97* 99 105  CO2 _0 GLUCOSE 101* 112* 103*  BUN 29* 37* 29*  CREATININE 1.43* 1.41* 1.31*  CALCIUM 9.4 8.8* 9.5  GFRNONAA 37* 37* 41*  PROT 7.2  --   --    ALBUMIN 4.2  --   --   AST 23  --   --   ALT 20  --   --   ALKPHOS 63  --   --   BILITOT 0.5  --   --       No results found.  No problem-specific Assessment & Plan notes found for this encounter.  Encounter Diagnosis  Name Primary?   Symptomatic anemia Yes   Symptomatic Anemia: Chronic and essentially stable. Remains anemic. She is agreeable to only one dose of Venofer as she does not feel that she needs more. She will return in 4 months for follow up with MD.Will continue working with PCP on elevated BP and caregiver stress.     All questions were answered. The patient knows to call the clinic with any problems, questions or concerns.      Hughie Closs, PA-C 07/02/2022 4:06 PM

## 2022-07-09 ENCOUNTER — Ambulatory Visit: Payer: Medicare Other

## 2022-07-16 ENCOUNTER — Ambulatory Visit: Payer: Medicare Other

## 2022-07-23 ENCOUNTER — Ambulatory Visit: Payer: Medicare Other

## 2022-08-11 ENCOUNTER — Other Ambulatory Visit: Payer: Self-pay | Admitting: Internal Medicine

## 2022-08-11 ENCOUNTER — Other Ambulatory Visit: Payer: Self-pay | Admitting: Obstetrics and Gynecology

## 2022-08-11 DIAGNOSIS — Z1231 Encounter for screening mammogram for malignant neoplasm of breast: Secondary | ICD-10-CM

## 2022-09-21 ENCOUNTER — Ambulatory Visit
Admission: RE | Admit: 2022-09-21 | Discharge: 2022-09-21 | Disposition: A | Discharge status: Home / Self Care | Payer: Medicare Other | Source: Ambulatory Visit | Attending: Internal Medicine | Admitting: Internal Medicine

## 2022-09-21 DIAGNOSIS — Z1231 Encounter for screening mammogram for malignant neoplasm of breast: Secondary | ICD-10-CM | POA: Insufficient documentation

## 2022-10-25 ENCOUNTER — Encounter: Payer: Self-pay | Admitting: Internal Medicine

## 2022-11-01 ENCOUNTER — Inpatient Hospital Stay: Payer: Medicare Other | Attending: Internal Medicine

## 2022-11-01 ENCOUNTER — Encounter: Payer: Self-pay | Admitting: Internal Medicine

## 2022-11-01 ENCOUNTER — Inpatient Hospital Stay (HOSPITAL_BASED_OUTPATIENT_CLINIC_OR_DEPARTMENT_OTHER): Payer: Medicare Other | Admitting: Internal Medicine

## 2022-11-01 VITALS — BP 178/74 | HR 65 | Temp 97.2°F | Resp 18 | Wt 171.4 lb

## 2022-11-01 DIAGNOSIS — Z85820 Personal history of malignant melanoma of skin: Secondary | ICD-10-CM | POA: Insufficient documentation

## 2022-11-01 DIAGNOSIS — E538 Deficiency of other specified B group vitamins: Secondary | ICD-10-CM | POA: Diagnosis present

## 2022-11-01 DIAGNOSIS — N183 Chronic kidney disease, stage 3 unspecified: Secondary | ICD-10-CM | POA: Insufficient documentation

## 2022-11-01 DIAGNOSIS — D649 Anemia, unspecified: Secondary | ICD-10-CM

## 2022-11-01 DIAGNOSIS — D631 Anemia in chronic kidney disease: Secondary | ICD-10-CM | POA: Insufficient documentation

## 2022-11-01 LAB — BASIC METABOLIC PANEL
Anion gap: 7 (ref 5–15)
BUN: 33 mg/dL — ABNORMAL HIGH (ref 8–23)
CO2: 25 mmol/L (ref 22–32)
Calcium: 9 mg/dL (ref 8.9–10.3)
Chloride: 100 mmol/L (ref 98–111)
Creatinine, Ser: 1.27 mg/dL — ABNORMAL HIGH (ref 0.44–1.00)
GFR, Estimated: 42 mL/min — ABNORMAL LOW (ref >60)
Glucose, Bld: 108 mg/dL — ABNORMAL HIGH (ref 70–99)
Potassium: 4.3 mmol/L (ref 3.5–5.1)
Sodium: 132 mmol/L — ABNORMAL LOW (ref 135–145)

## 2022-11-01 LAB — CBC WITH DIFFERENTIAL/PLATELET
Abs Immature Granulocytes: 0.04 10*3/uL (ref 0.00–0.07)
Basophils Absolute: 0 10*3/uL (ref 0.0–0.1)
Basophils Relative: 1 %
Eosinophils Absolute: 0.1 10*3/uL (ref 0.0–0.5)
Eosinophils Relative: 2 %
HCT: 32.5 % — ABNORMAL LOW (ref 36.0–46.0)
Hemoglobin: 10.8 g/dL — ABNORMAL LOW (ref 12.0–15.0)
Immature Granulocytes: 1 %
Lymphocytes Relative: 23 %
Lymphs Abs: 2 10*3/uL (ref 0.7–4.0)
MCH: 31.1 pg (ref 26.0–34.0)
MCHC: 33.2 g/dL (ref 30.0–36.0)
MCV: 93.7 fL (ref 80.0–100.0)
Monocytes Absolute: 0.9 10*3/uL (ref 0.1–1.0)
Monocytes Relative: 10 %
Neutro Abs: 5.6 10*3/uL (ref 1.7–7.7)
Neutrophils Relative %: 63 %
Platelets: 219 10*3/uL (ref 150–400)
RBC: 3.47 MIL/uL — ABNORMAL LOW (ref 3.87–5.11)
RDW: 12.6 % (ref 11.5–15.5)
WBC: 8.6 10*3/uL (ref 4.0–10.5)
nRBC: 0 % (ref 0.0–0.2)

## 2022-11-01 LAB — VITAMIN B12: Vitamin B-12: 1360 pg/mL — ABNORMAL HIGH (ref 180–914)

## 2022-11-01 LAB — IRON AND TIBC
Iron: 94 ug/dL (ref 28–170)
Saturation Ratios: 28 % (ref 10.4–31.8)
TIBC: 339 ug/dL (ref 250–450)
UIBC: 245 ug/dL

## 2022-11-01 LAB — FERRITIN: Ferritin: 248 ng/mL (ref 11–307)

## 2022-11-01 NOTE — Assessment & Plan Note (Addendum)
#  Chronic mild anemia recent worsening Jan 2023-hemoglobin 9-10; not on oral iron.  Poor tolerance. JAN 2024- Iron sat- 28%; Ferritin-248. S/p venofer- Hb improved ~10- HOLD with venofer today.  Discuss need for possible Retacrit/boosters.  #Etiology of anemia- ?  CKD versus others.  Kappa lambda light chain ratio slightly abnormal-likely secondary insufficiency; M protein negative.   # CKD-stage III/chronic Hyponatremia: - ?  Etiology- recommend gatorade; limit free water/salt tablets '[]'$   Avoid motrin. [? Rib pain- 1 a day]  # B12 def [id number]; PCP]-on B12 injections. JAN 2023- 1200- defer to PCP.   Labs printed # DISPOSITION: # follow up in 6 months MD; lab- cbc/bmp; iron studies/ferritin; b12 levels  possible venofer-- Dr.B

## 2022-11-01 NOTE — Progress Notes (Signed)
Patient denies new problems/concerns today.   °

## 2022-11-01 NOTE — Progress Notes (Signed)
[facility] NOTE  Patient Care Team: Rusty Aus, MD as PCP - General (Internal Medicine) Cammie Sickle, MD as Consulting Physician (Hematology)  CHIEF COMPLAINTS/PURPOSE OF CONSULTATION: ANEMIA  HEMATOLOGY HISTORY:  # CHRONIC ANEMIA [2019- Hb-10]; JAN 2023- 9.6; GFR ~40s; EGD-; colonoscopy-2010 [normal; Dr.Skulskie];  CKD versus others.  Kappa lambda light chain ratio slightly abnormal-likely secondary insufficiency; M protein negative.  Clinically not suggestive of multiple myeloma.  Would not recommend a bone marrow biopsy at this time.  # CKD-stage III/chronic Hyponatremia: -[109 in past- [name]] ?  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  Ambulating independently.  Nicole Shaw 82 y.o.  female with chronic anemia sec to CKD-III is here for a follow up.  Patient denies new problems/concerns today. S/p IV Venofer- energy improved.  Mild fatigue.  Review of Systems  Constitutional:  Positive for malaise/fatigue. Negative for chills, diaphoresis, fever and weight loss.  HENT:  Negative for nosebleeds and sore throat.   Eyes:  Negative for double vision.  Respiratory:  Negative for cough, hemoptysis, sputum production, shortness of breath and wheezing.   Cardiovascular:  Negative for chest pain, palpitations, orthopnea and leg swelling.  Gastrointestinal:  Negative for abdominal pain, blood in stool, constipation, diarrhea, heartburn, melena, nausea and vomiting.  Genitourinary:  Negative for dysuria, frequency and urgency.  Musculoskeletal:  Positive for back pain and joint pain.  Skin: Negative.  Negative for itching and rash.  Neurological:  Negative for dizziness, tingling, focal weakness, weakness and headaches.  Endo/Heme/Allergies:  Does not bruise/bleed easily.   Psychiatric/Behavioral:  Negative for depression. The patient is not nervous/anxious and does not have insomnia.     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)    Panic attacks    Pernicious anemia     SURGICAL HISTORY: Past Surgical History:  Procedure Laterality Date   CATARACT EXTRACTION W/PHACO Left 05/19/2021   Procedure: CATARACT EXTRACTION PHACO AND INTRAOCULAR LENS PLACEMENT ([place]) LEFT  DIABETIC VIVITY LENS 7.37 01:04.6;  Surgeon: Birder Robson, MD;  Location: [place];  Service: Ophthalmology;  Laterality: Left;   CATARACT EXTRACTION W/PHACO Right 06/02/2021   Procedure: CATARACT EXTRACTION PHACO AND INTRAOCULAR LENS PLACEMENT ([facility]) RIGHT DIABETIC VIVITY LENS 10.95 01:06.0;  Surgeon: Birder Robson, MD;  Location: [place] [facility];  Service: Ophthalmology;  Laterality: Right;   KNEE SURGERY     THYROIDECTOMY      SOCIAL HISTORY: Social History   Socioeconomic History   Marital status: Single    Spouse name: Not on file   Number of children: Not on file   Years of education: Not on file   Highest education level: Not on file  Occupational History   Not on file  Tobacco Use   Smoking status: Never    Passive exposure: Never  Smokeless tobacco: Never  Vaping Use   Vaping Use: Never used  Substance and Sexual Activity   Alcohol use: Never   Drug use: Never   Sexual activity: Not on file  Other Topics Concern   Not on file  Social History Narrative   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    Social Determinants of Health   Financial Resource Strain: Not on file  Food Insecurity: Not on file  Transportation Needs: Not on file  Physical Activity: Not on file  Stress: Not on file  Social Connections: Not on file  Intimate Partner Violence: Not on file    FAMILY HISTORY: Family History  Problem Relation Age of Onset   Diabetes Mother    Heart attack Mother    Osteoporosis Mother    Stroke Mother    Ulcers Father    COPD Father    Breast cancer Sister 8   Diabetes Sister    Breast cancer Maternal Aunt     ALLERGIES:  is allergic to ace inhibitors, penicillins, sulfa antibiotics, trazodone, and amlodipine.  MEDICATIONS:  Current Outpatient Medications  Medication Sig Dispense Refill   Cholecalciferol (VITAMIN D3) 2000 units capsule Take 1 capsule by mouth daily.     clobetasol cream (TEMOVATE) 0.25 % Apply 1 application topically 2 (two) times daily.     Cyanocobalamin (VITAMIN B-12 IJ) Inject as directed every 30 (thirty) days.     DOCUSATE SODIUM PO Take by mouth.     escitalopram (LEXAPRO) 5 MG tablet Take 5 mg by mouth daily.     famotidine (PEPCID) 40 MG tablet Take 40 mg by mouth at bedtime.     famotidine (PEPCID) 40 MG tablet Take 1 tablet by mouth at bedtime.     levothyroxine (SYNTHROID) 100 MCG tablet Take by mouth.     levothyroxine (SYNTHROID, LEVOTHROID) 100 MCG tablet Take 1 tablet by mouth daily.     loratadine (CLARITIN) 10 MG tablet Take 1 tablet by mouth daily.     metoprolol succinate (TOPROL-XL) 50 MG 24 hr tablet Take 1 tablet by mouth daily. Take 1 every morning and 1/2 qhs     simvastatin (ZOCOR) 10 MG tablet Take 1 tablet by mouth daily.     simvastatin (ZOCOR) 10 MG tablet Take 1 tablet by mouth at bedtime.     telmisartan-hydrochlorothiazide (MICARDIS HCT) 80-12.5 MG tablet Take 1 tablet by mouth daily.     aspirin 81 MG EC tablet Take by mouth. (Patient not taking: Reported on 11/01/2022)      furosemide (LASIX) 20 MG tablet Take 20 mg by mouth daily as needed. (Patient not taking: Reported on 11/01/2022)     pantoprazole (PROTONIX) 40 MG tablet Take 40 mg by mouth daily. (Patient not taking: Reported on 11/01/2022)     No current facility-administered medications for this visit.      PHYSICAL EXAMINATION:   Vitals:   11/01/22 1300  BP: (!) 178/74  Pulse: 65  Resp: 18  Temp: (!) 97.2 F (36.2 C)   Filed Weights   11/01/22 1300  Weight: 171 lb 6.4 oz (77.7 kg)    Physical Exam Vitals and nursing note reviewed.  HENT:     Head: Normocephalic and atraumatic.     Mouth/Throat:     Pharynx: Oropharynx is clear.  Eyes:     Extraocular Movements: Extraocular movements intact.     Pupils: Pupils are equal, round, and reactive to light.  Cardiovascular:  Rate and Rhythm: Normal rate and regular rhythm.  Pulmonary:     Comments: Decreased breath sounds bilaterally.  Abdominal:     Palpations: Abdomen is soft.  Musculoskeletal:        General: Normal range of motion.     Cervical back: Normal range of motion.  Skin:    General: Skin is warm.  Neurological:     General: No focal deficit present.     Mental Status: She is alert and oriented to person, place, and time.  Psychiatric:        Behavior: Behavior normal.        Judgment: Judgment normal.     LABORATORY DATA:  I have reviewed the data as listed Lab Results  Component Value Date   WBC 8.6 11/01/2022   HGB 10.8 (L) 11/01/2022   HCT 32.5 (L) 11/01/2022   MCV 93.7 11/01/2022   PLT 219 11/01/2022   Recent Labs    11/17/21 1436 02/23/22 1235 07/02/22 1338 11/01/22 1251  NA 129* 128* 136 132*  K 4.1 4.4 4.5 4.3  CL 97* 99 105 100  CO2 '24 24 26 25  '$ GLUCOSE 101* 112* 103* 108*  BUN 29* 37* 29* 33*  CREATININE 1.43* 1.41* 1.31* 1.27*  CALCIUM 9.4 8.8* 9.5 9.0  GFRNONAA 37* 37* 41* 42*  PROT 7.2  --   --   --   ALBUMIN 4.2  --   --   --   AST 23  --   --   --   ALT 20  --   --   --    ALKPHOS 63  --   --   --   BILITOT 0.5  --   --   --      No results found.  Symptomatic anemia # Chronic mild anemia recent worsening Jan 2023-hemoglobin 9-10; not on oral iron.  Poor tolerance. JAN 2024- Iron sat- 28%; Ferritin-248. S/p venofer- Hb improved ~10- HOLD with venofer today.  Discuss need for possible Retacrit/boosters.  #Etiology of anemia- ?  CKD versus others.  Kappa lambda light chain ratio slightly abnormal-likely secondary insufficiency; M protein negative.   # CKD-stage III/chronic Hyponatremia: - ?  Etiology- recommend gatorade; limit free water/salt tablets '[]'$   Avoid motrin. [? Rib pain- 1 a day]  # B12 def [id number]; PCP]-on B12 injections. JAN 2023- 1200- defer to PCP.   Labs printed # DISPOSITION: # follow up in 6 months MD; lab- cbc/bmp; iron studies/ferritin; b12 levels  possible venofer-- Dr.B     All questions were answered. The patient knows to call the clinic with any problems, questions or concerns.      Cammie Sickle, MD 11/08/2022 9:50 AM

## 2022-11-08 ENCOUNTER — Encounter: Payer: Self-pay | Admitting: Internal Medicine

## 2023-05-02 ENCOUNTER — Ambulatory Visit: Payer: Medicare Other

## 2023-05-02 ENCOUNTER — Ambulatory Visit: Payer: Medicare Other | Admitting: Internal Medicine

## 2023-05-02 ENCOUNTER — Other Ambulatory Visit: Payer: Medicare Other

## 2023-05-16 ENCOUNTER — Ambulatory Visit: Payer: Medicare Other

## 2023-05-16 ENCOUNTER — Other Ambulatory Visit: Payer: Medicare Other

## 2023-05-16 ENCOUNTER — Ambulatory Visit: Payer: Medicare Other | Admitting: Internal Medicine

## 2023-05-31 ENCOUNTER — Other Ambulatory Visit: Payer: Medicare Other

## 2023-05-31 ENCOUNTER — Ambulatory Visit: Payer: Medicare Other | Admitting: Internal Medicine

## 2023-05-31 ENCOUNTER — Ambulatory Visit: Payer: Medicare Other

## 2023-07-02 IMAGING — MG MM DIGITAL SCREENING BILAT W/ TOMO AND CAD
6 of 12 series · 6 of 36 positions shown · non-contrast
Comparison: Previous exam(s).

CLINICAL DATA: Screening.

EXAM:
DIGITAL SCREENING BILATERAL MAMMOGRAM WITH TOMOSYNTHESIS AND CAD
TECHNIQUE: Bilateral screening digital craniocaudal and mediolateral oblique
mammograms were obtained. Bilateral screening digital breast
tomosynthesis was performed. The images were evaluated with
computer-aided detection.

[L CC synth-2D]
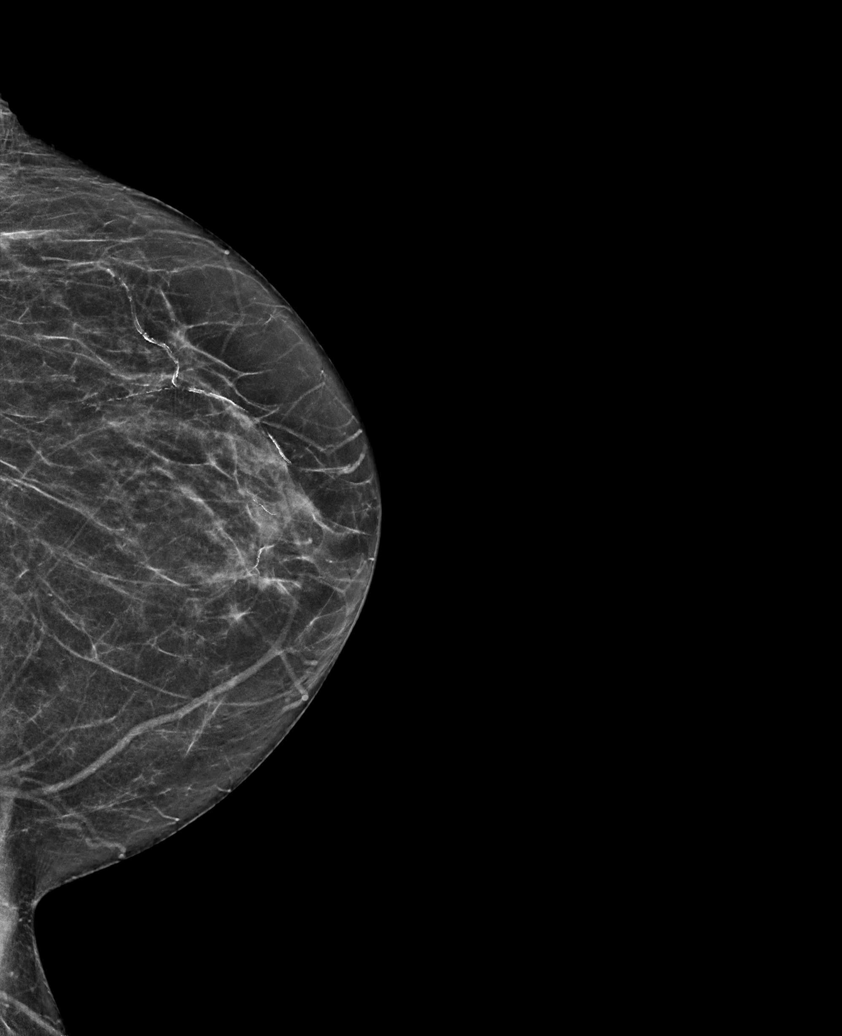

[L MLO synth-2D (1 of 2)]
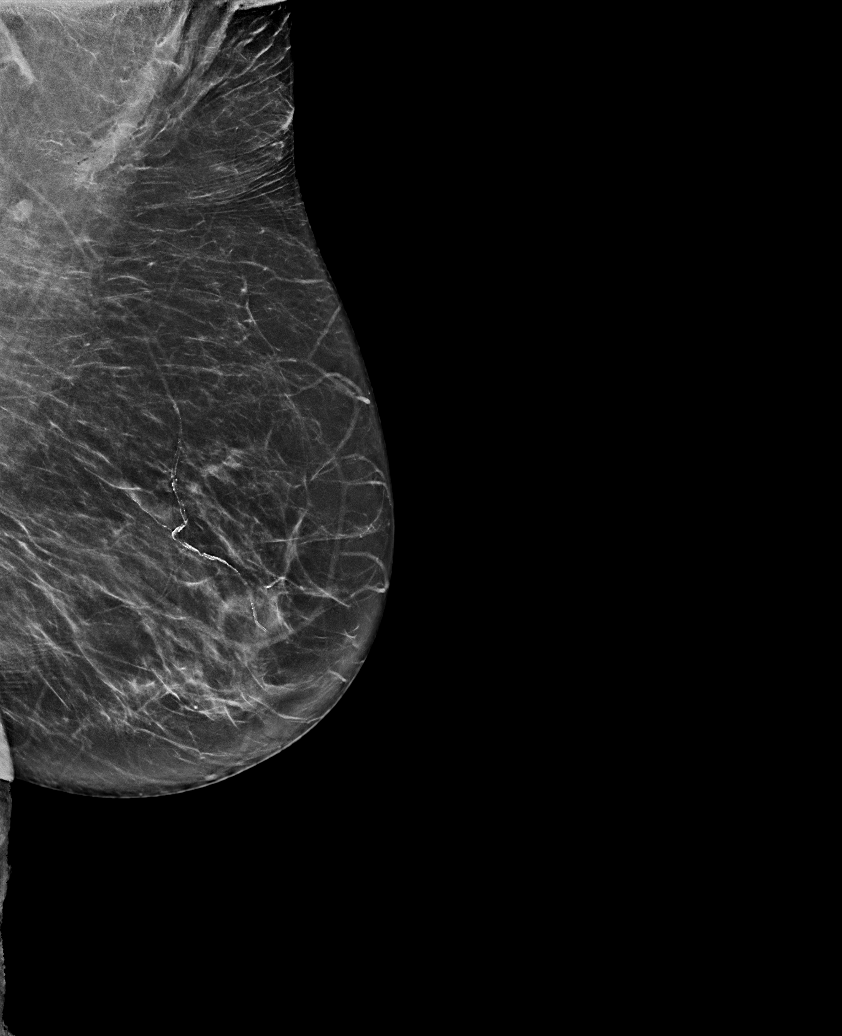

[L MLO synth-2D (2 of 2)]
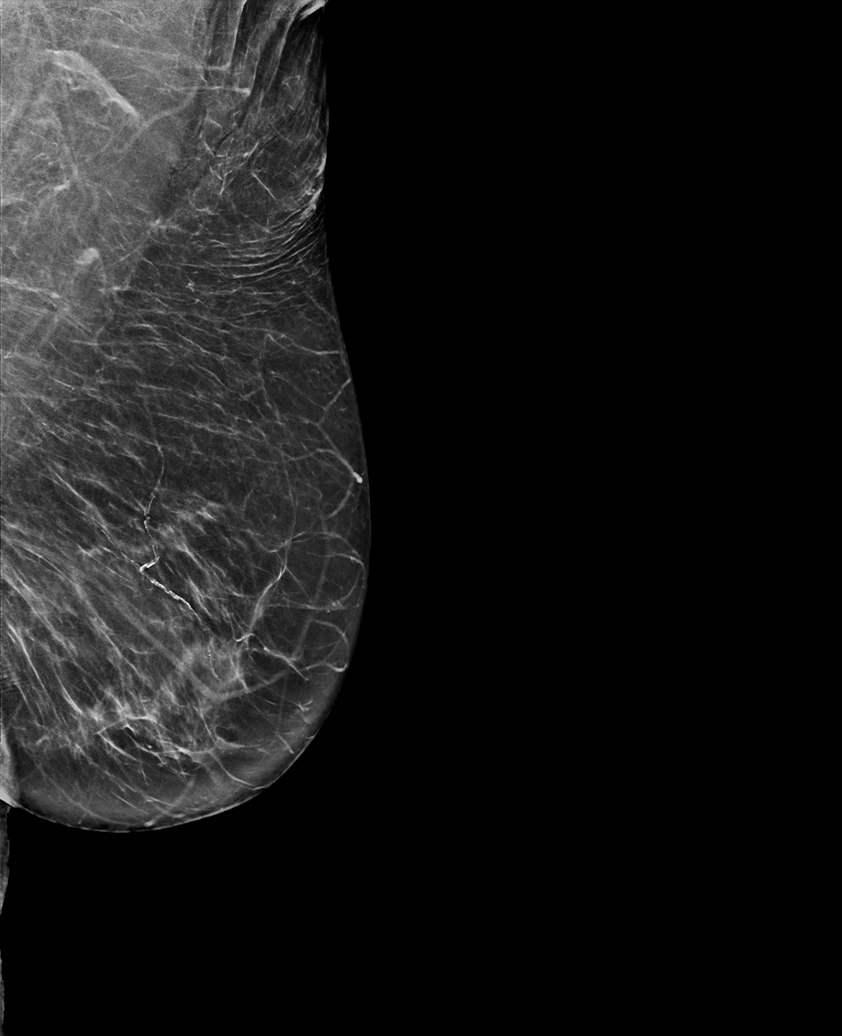

[R MLO synth-2D (1 of 2)]
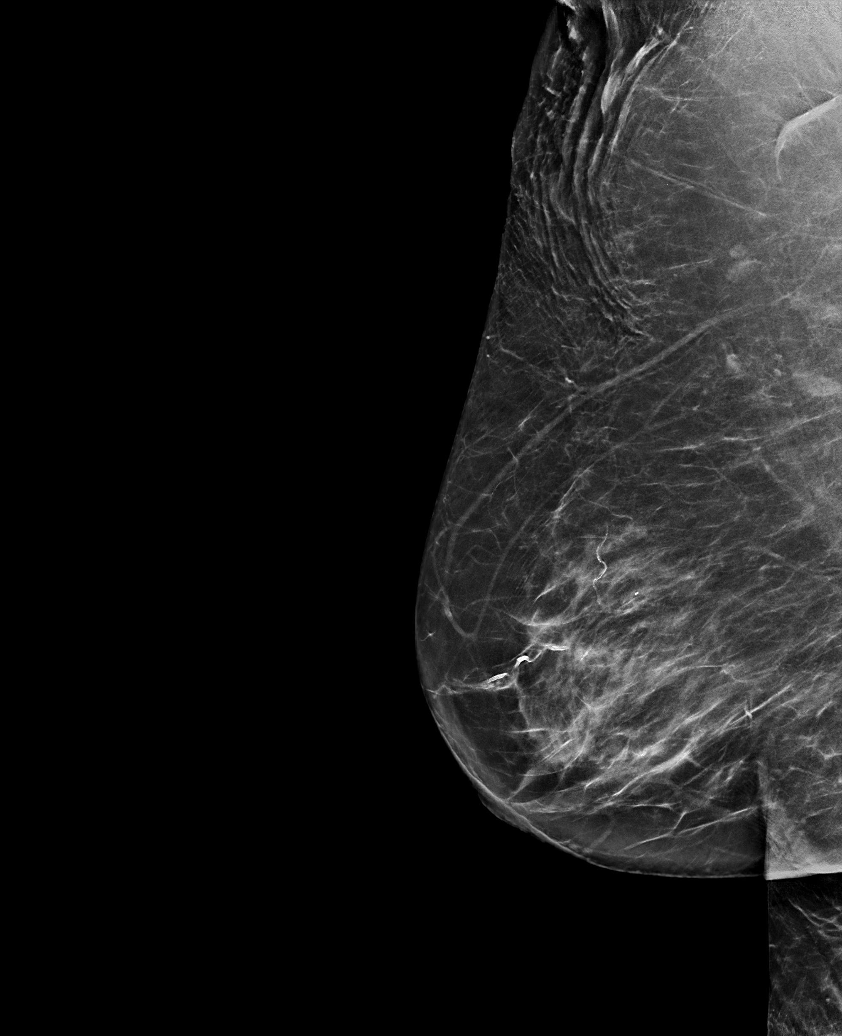

[R MLO synth-2D (2 of 2)]
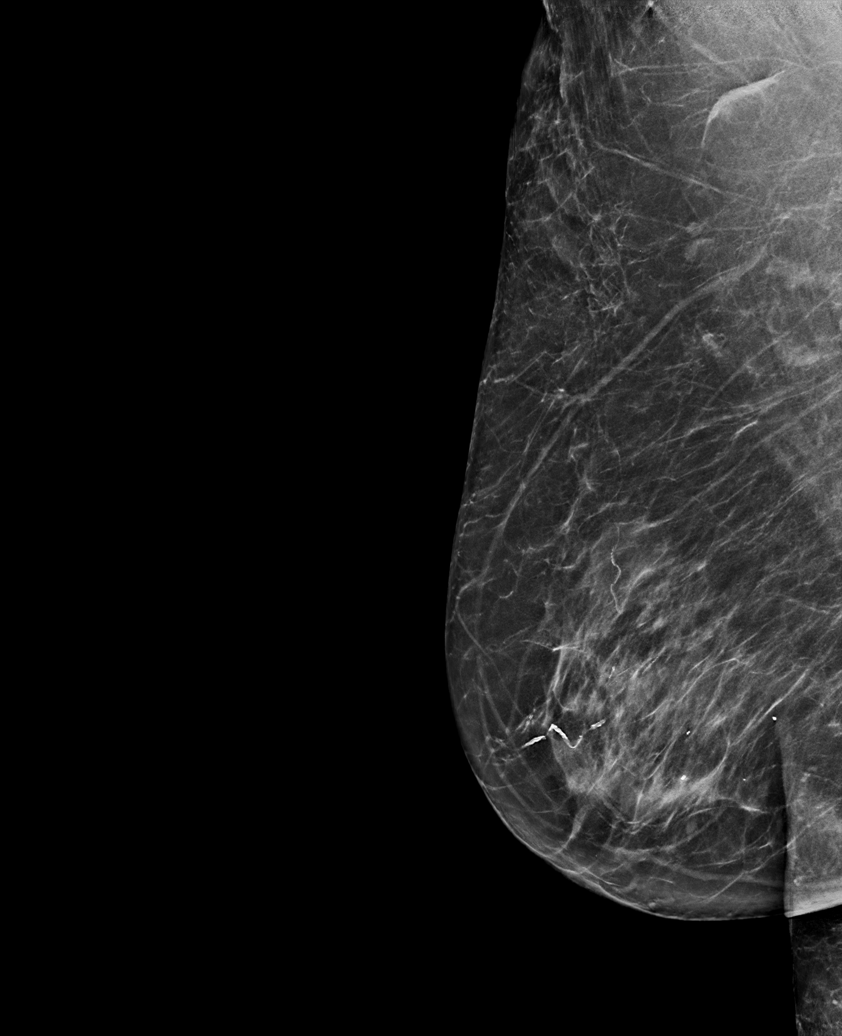

[R CC synth-2D]
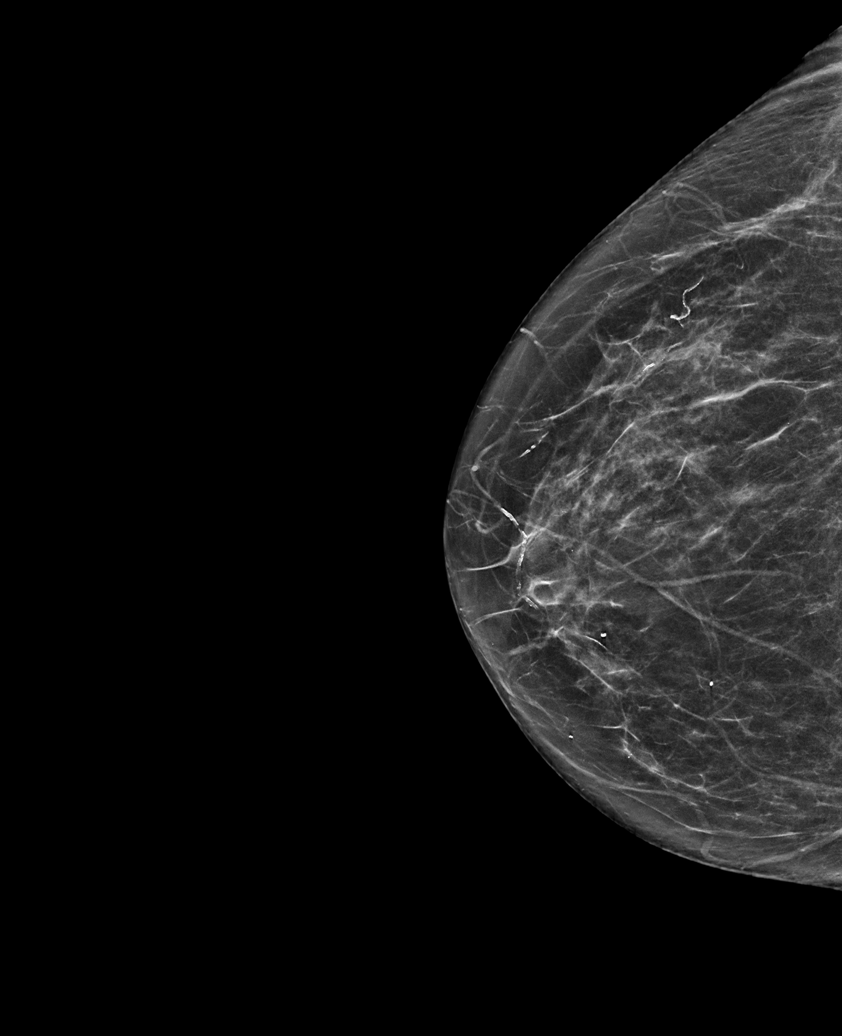

[6 of 36 positions shown; findings below may reference images not displayed]

ACR Breast Density Category b: There are scattered areas of
fibroglandular density.
FINDINGS: There are no findings suspicious for malignancy.
IMPRESSION: No mammographic evidence of malignancy. A result letter of this
screening mammogram will be mailed directly to the patient.

RECOMMENDATION:
Screening mammogram in one year. (Code:51-O-LD2)

BI-RADS CATEGORY  1: Negative.

## 2023-07-11 ENCOUNTER — Inpatient Hospital Stay: Payer: Medicare Other

## 2023-07-11 ENCOUNTER — Inpatient Hospital Stay: Payer: Medicare Other | Attending: Internal Medicine

## 2023-07-11 ENCOUNTER — Encounter: Payer: Self-pay | Admitting: Internal Medicine

## 2023-07-11 ENCOUNTER — Inpatient Hospital Stay (HOSPITAL_BASED_OUTPATIENT_CLINIC_OR_DEPARTMENT_OTHER): Payer: Medicare Other | Admitting: Internal Medicine

## 2023-07-11 DIAGNOSIS — M7989 Other specified soft tissue disorders: Secondary | ICD-10-CM | POA: Diagnosis not present

## 2023-07-11 DIAGNOSIS — E1122 Type 2 diabetes mellitus with diabetic chronic kidney disease: Secondary | ICD-10-CM | POA: Diagnosis not present

## 2023-07-11 DIAGNOSIS — D649 Anemia, unspecified: Secondary | ICD-10-CM | POA: Diagnosis present

## 2023-07-11 DIAGNOSIS — Z79899 Other long term (current) drug therapy: Secondary | ICD-10-CM | POA: Diagnosis not present

## 2023-07-11 DIAGNOSIS — I129 Hypertensive chronic kidney disease with stage 1 through stage 4 chronic kidney disease, or unspecified chronic kidney disease: Secondary | ICD-10-CM | POA: Diagnosis not present

## 2023-07-11 DIAGNOSIS — N183 Chronic kidney disease, stage 3 unspecified: Secondary | ICD-10-CM | POA: Diagnosis not present

## 2023-07-11 LAB — CBC WITH DIFFERENTIAL/PLATELET
Abs Immature Granulocytes: 0.07 10*3/uL (ref 0.00–0.07)
Basophils Absolute: 0.1 10*3/uL (ref 0.0–0.1)
Basophils Relative: 1 %
Eosinophils Absolute: 0.2 10*3/uL (ref 0.0–0.5)
Eosinophils Relative: 2 %
HCT: 34.2 % — ABNORMAL LOW (ref 36.0–46.0)
Hemoglobin: 11 g/dL — ABNORMAL LOW (ref 12.0–15.0)
Immature Granulocytes: 1 %
Lymphocytes Relative: 25 %
Lymphs Abs: 2.4 10*3/uL (ref 0.7–4.0)
MCH: 30.7 pg (ref 26.0–34.0)
MCHC: 32.2 g/dL (ref 30.0–36.0)
MCV: 95.5 fL (ref 80.0–100.0)
Monocytes Absolute: 0.9 10*3/uL (ref 0.1–1.0)
Monocytes Relative: 10 %
Neutro Abs: 5.7 10*3/uL (ref 1.7–7.7)
Neutrophils Relative %: 61 %
Platelets: 228 10*3/uL (ref 150–400)
RBC: 3.58 MIL/uL — ABNORMAL LOW (ref 3.87–5.11)
RDW: 13.2 % (ref 11.5–15.5)
WBC: 9.3 10*3/uL (ref 4.0–10.5)
nRBC: 0 % (ref 0.0–0.2)

## 2023-07-11 LAB — VITAMIN B12: Vitamin B-12: 477 pg/mL (ref 180–914)

## 2023-07-11 LAB — FERRITIN: Ferritin: 296 ng/mL (ref 11–307)

## 2023-07-11 LAB — BASIC METABOLIC PANEL
Anion gap: 10 (ref 5–15)
BUN: 26 mg/dL — ABNORMAL HIGH (ref 8–23)
CO2: 23 mmol/L (ref 22–32)
Calcium: 9.4 mg/dL (ref 8.9–10.3)
Chloride: 99 mmol/L (ref 98–111)
Creatinine, Ser: 1.39 mg/dL — ABNORMAL HIGH (ref 0.44–1.00)
GFR, Estimated: 38 mL/min — ABNORMAL LOW (ref >60)
Glucose, Bld: 103 mg/dL — ABNORMAL HIGH (ref 70–99)
Potassium: 4.2 mmol/L (ref 3.5–5.1)
Sodium: 132 mmol/L — ABNORMAL LOW (ref 135–145)

## 2023-07-11 LAB — IRON AND TIBC
Iron: 90 ug/dL (ref 28–170)
Saturation Ratios: 24 % (ref 10.4–31.8)
TIBC: 371 ug/dL (ref 250–450)
UIBC: 281 ug/dL

## 2023-07-11 NOTE — Progress Notes (Signed)
Fatigue/weakness:  no Dyspena: no Light headedness: no Blood in stool: no  She will get B12 injection with Dr. Rondel Baton office next week.  Has had an echo since last visit at [facility].  C/o gastritis.  Palpatations due to anxiety, panioc attacks.

## 2023-07-11 NOTE — Assessment & Plan Note (Addendum)
#   Chronic mild anemia recent worsening Jan 2023-hemoglobin 9-10; not on oral iron.  Poor tolerance. JAN 2024- Iron sat- 28%; Ferritin-248. S/p venofer- Hb improved ~11-  HOLD with venofer today.  Discuss need for possible Retacrit/boosters.  #Etiology of anemia- ?  CKD versus others.  Kappa lambda light chain ratio slightly abnormal-likely secondary insufficiency; M protein negative-   # CKD-stage III/chronic Hyponatremia: - ?  Etiology- recommend gatorade; limit free water/salt tablets []   Avoid motrin. [? Rib pain- 1 a day]- stable.   # Elevated BP- recommend checking up-   # B12 def [id number]; PCP]-on B12 injections. JAN 2023- 1200- defer to PCP.   Labs printed # DISPOSITION: # no venofer  # follow up in 6 months MD; lab- cbc/bmp; iron studies/ferritin; b12 levels  possible venofer-- Dr.B

## 2023-07-11 NOTE — Progress Notes (Signed)
[facility] CONSULT NOTE  Patient Care Team: Danella Penton, MD as PCP - General (Internal Medicine) Earna Coder, MD as Consulting Physician (Hematology)  CHIEF COMPLAINTS/PURPOSE OF CONSULTATION: ANEMIA  HEMATOLOGY HISTORY:  # CHRONIC ANEMIA [2019- Hb-10]; JAN 2023- 9.6; GFR ~40s; EGD-; colonoscopy-2010 [normal; Dr.Skulskie];  CKD versus others.  Kappa lambda light chain ratio slightly abnormal-likely secondary insufficiency; M protein negative.  Clinically not suggestive of multiple myeloma.  Would not recommend a bone marrow biopsy at this time.  # CKD-stage III/chronic Hyponatremia: -[109 in past- [facility]] ?  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  Ambulating independently.  Nicole Shaw 82 y.o.  female with chronic anemia sec to CKD-III is here for a follow up.  Denies any fatigue.  Denies any worsening shortness of breath or cough.   She will get B12 injection with Dr. Rondel Baton office next week.   Has had an echo since last visit at [facility]   Palpatations due to anxiety, panioc attack   Review of Systems  Constitutional:  Positive for malaise/fatigue. Negative for chills, diaphoresis, fever and weight loss.  HENT:  Negative for nosebleeds and sore throat.   Eyes:  Negative for double vision.  Respiratory:  Negative for cough, hemoptysis, sputum production, shortness of breath and wheezing.   Cardiovascular:  Negative for chest pain, palpitations, orthopnea and leg swelling.  Gastrointestinal:  Negative for abdominal pain, blood in stool, constipation, diarrhea, heartburn, melena, nausea and vomiting.  Genitourinary:  Negative for dysuria, frequency and urgency.  Musculoskeletal:  Positive for back pain and joint pain.  Skin: Negative.  Negative for itching and rash.   Neurological:  Negative for dizziness, tingling, focal weakness, weakness and headaches.  Endo/Heme/Allergies:  Does not bruise/bleed easily.  Psychiatric/Behavioral:  Negative for depression. The patient is not nervous/anxious and does not have insomnia.     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)    Panic attacks    Pernicious anemia     SURGICAL HISTORY: Past Surgical History:  Procedure Laterality Date   CATARACT EXTRACTION W/PHACO Left 05/19/2021   Procedure: CATARACT EXTRACTION PHACO AND INTRAOCULAR LENS PLACEMENT ([facility]) LEFT  DIABETIC VIVITY LENS 7.37 01:04.6;  Surgeon: Galen Manila, MD;  Location: [facility];  Service: Ophthalmology;  Laterality: Left;   CATARACT EXTRACTION W/PHACO Right 06/02/2021   Procedure: CATARACT EXTRACTION PHACO AND INTRAOCULAR LENS PLACEMENT ([facility]) RIGHT DIABETIC VIVITY LENS 10.95 01:06.0;  Surgeon: Galen Manila, MD;  Location[facility];  Service: Ophthalmology;  Laterality: Right;   KNEE SURGERY     THYROIDECTOMY      SOCIAL HISTORY: Social History   Socioeconomic History   Marital status: Single    Spouse name: Not on file   Number of children: Not on file   Years of education: Not  on file   Highest education level: Not on file  Occupational History   Not on file  Tobacco Use   Smoking status: Never    Passive exposure: Never   Smokeless tobacco: Never  Vaping Use   Vaping status: Never Used  Substance and Sexual Activity   Alcohol use: Never    Drug use: Never   Sexual activity: Not on file  Other Topics Concern   Not on file  Social History Narrative   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    Social Determinants of Health   Financial Resource Strain: Low Risk  (04/15/2023)   Received from [facility], [facility]   Overall Financial Resource Strain (CARDIA)    Difficulty of Paying Living Expenses: Not hard at all  Food Insecurity: No Food Insecurity (04/15/2023)   Received from [facility], [facility]   Hunger Vital Sign    Worried About Running Out of Food in the Last Year: Never true    Ran Out of Food in the Last Year: Never true  Transportation Needs: No Transportation Needs (04/15/2023)   Received from [facility], [facility]   [facility] - Transportation    In the past 12 months, has lack of transportation kept you from medical appointments or from getting medications?: No    Lack of Transportation (Non-Medical): No  Physical Activity: Not on file  Stress: Not on file  Social Connections: Not on file  Intimate Partner Violence: Not on file    FAMILY HISTORY: Family History  Problem Relation Age of Onset   Diabetes Mother    Heart attack Mother    Osteoporosis Mother    Stroke Mother    Ulcers Father    COPD Father    Breast cancer Sister 62   Diabetes Sister    Breast cancer Maternal Aunt     ALLERGIES:  is allergic to ace inhibitors, penicillins, sulfa antibiotics, trazodone, and amlodipine.  MEDICATIONS:  Current Outpatient Medications  Medication Sig Dispense Refill   Cholecalciferol (VITAMIN D3) 2000 units capsule Take 1 capsule by mouth daily.     clobetasol cream (TEMOVATE) 0.05 % Apply 1 application topically 2 (two) times daily.     Cyanocobalamin (VITAMIN B-12 IJ) Inject as directed every 30 (thirty) days.     DOCUSATE SODIUM PO Take by mouth.     escitalopram  (LEXAPRO) 5 MG tablet Take 10 mg by mouth daily.     famotidine (PEPCID) 40 MG tablet Take 40 mg by mouth at bedtime.     famotidine (PEPCID) 40 MG tablet Take 1 tablet by mouth at bedtime.     levothyroxine (SYNTHROID, LEVOTHROID) 100 MCG tablet Take 1 tablet by mouth daily.     loratadine (CLARITIN) 10 MG tablet Take 1 tablet by mouth daily.     pantoprazole (PROTONIX) 40 MG tablet Take 40 mg by mouth daily.     simvastatin (ZOCOR) 10 MG tablet Take 1 tablet by mouth daily.     telmisartan-hydrochlorothiazide (MICARDIS HCT) 80-12.5 MG tablet Take 1 tablet by mouth daily. Take 1/2 tablet qd     Carboxymeth-Glyc-Polysorb PF (REFRESH OPTIVE MEGA-3) 0.5-1-0.5 % SOLN Apply to eye.     metoprolol succinate (TOPROL-XL) 50 MG 24 hr tablet Take 25 mg by mouth daily. Take 1 every morning and 1/2 qhs     No current facility-administered medications for this visit.  PHYSICAL EXAMINATION:   Vitals:   07/11/23 1505 07/11/23 1531  BP: (!) 206/102 (!) 160/110  Pulse:    Temp:    SpO2:     Filed Weights   07/11/23 1446  Weight: 175 lb 9.6 oz (79.7 kg)    Physical Exam Vitals and nursing note reviewed.  HENT:     Head: Normocephalic and atraumatic.     Mouth/Throat:     Pharynx: Oropharynx is clear.  Eyes:     Extraocular Movements: Extraocular movements intact.     Pupils: Pupils are equal, round, and reactive to light.  Cardiovascular:     Rate and Rhythm: Normal rate and regular rhythm.  Pulmonary:     Comments: Decreased breath sounds bilaterally.  Abdominal:     Palpations: Abdomen is soft.  Musculoskeletal:        General: Normal range of motion.     Cervical back: Normal range of motion.  Skin:    General: Skin is warm.  Neurological:     General: No focal deficit present.     Mental Status: She is alert and oriented to person, place, and time.  Psychiatric:        Behavior: Behavior normal.        Judgment: Judgment normal.     LABORATORY DATA:  I have  reviewed the data as listed Lab Results  Component Value Date   WBC 9.3 07/11/2023   HGB 11.0 (L) 07/11/2023   HCT 34.2 (L) 07/11/2023   MCV 95.5 07/11/2023   PLT 228 07/11/2023   Recent Labs    11/01/22 1251 07/11/23 1436  NA 132* 132*  K 4.3 4.2  CL 100 99  CO2 25 23  GLUCOSE 108* 103*  BUN 33* 26*  CREATININE 1.27* 1.39*  CALCIUM 9.0 9.4  GFRNONAA 42* 38*     No results found.  Symptomatic anemia # Chronic mild anemia recent worsening Jan 2023-hemoglobin 9-10; not on oral iron.  Poor tolerance. JAN 2024- Iron sat- 28%; Ferritin-248. S/p venofer- Hb improved ~11-  HOLD with venofer today.  Discuss need for possible Retacrit/boosters.  #Etiology of anemia- ?  CKD versus others.  Kappa lambda light chain ratio slightly abnormal-likely secondary insufficiency; M protein negative-   # CKD-stage III/chronic Hyponatremia: - ?  Etiology- recommend gatorade; limit free water/salt tablets []   Avoid motrin. [? Rib pain- 1 a day]- stable.   # Elevated BP- recommend checking up-   # B12 def [id number]; PCP]-on B12 injections. JAN 2023- 1200- defer to PCP.   Labs printed # DISPOSITION: # no venofer  # follow up in 6 months MD; lab- cbc/bmp; iron studies/ferritin; b12 levels  possible venofer-- Dr.B      All questions were answered. The patient knows to call the clinic with any problems, questions or concerns.      Earna Coder, MD 07/11/2023 4:10 PM

## 2023-08-30 ENCOUNTER — Other Ambulatory Visit: Payer: Self-pay | Admitting: Obstetrics and Gynecology

## 2023-08-30 DIAGNOSIS — Z1231 Encounter for screening mammogram for malignant neoplasm of breast: Secondary | ICD-10-CM

## 2023-09-29 ENCOUNTER — Ambulatory Visit
Admission: RE | Admit: 2023-09-29 | Discharge: 2023-09-29 | Disposition: A | Discharge status: Home / Self Care | Payer: Medicare Other | Source: Ambulatory Visit | Attending: Obstetrics and Gynecology | Admitting: Obstetrics and Gynecology

## 2023-09-29 DIAGNOSIS — Z1231 Encounter for screening mammogram for malignant neoplasm of breast: Secondary | ICD-10-CM | POA: Diagnosis present

## 2023-10-04 ENCOUNTER — Other Ambulatory Visit: Payer: Self-pay | Admitting: General Surgery

## 2023-10-04 DIAGNOSIS — C4371 Malignant melanoma of right lower limb, including hip: Secondary | ICD-10-CM

## 2023-10-17 ENCOUNTER — Ambulatory Visit
Admission: RE | Admit: 2023-10-17 | Discharge: 2023-10-17 | Disposition: A | Discharge status: Home / Self Care | Payer: Medicare Other | Source: Ambulatory Visit | Attending: General Surgery | Admitting: General Surgery

## 2023-10-17 VITALS — Ht 62.0 in | Wt 175.0 lb

## 2023-10-17 DIAGNOSIS — I2510 Atherosclerotic heart disease of native coronary artery without angina pectoris: Secondary | ICD-10-CM | POA: Insufficient documentation

## 2023-10-17 DIAGNOSIS — K449 Diaphragmatic hernia without obstruction or gangrene: Secondary | ICD-10-CM | POA: Diagnosis not present

## 2023-10-17 DIAGNOSIS — C4371 Malignant melanoma of right lower limb, including hip: Secondary | ICD-10-CM | POA: Insufficient documentation

## 2023-10-17 DIAGNOSIS — D3502 Benign neoplasm of left adrenal gland: Secondary | ICD-10-CM | POA: Diagnosis not present

## 2023-10-17 DIAGNOSIS — I700 Atherosclerosis of aorta: Secondary | ICD-10-CM | POA: Insufficient documentation

## 2023-10-17 LAB — GLUCOSE, CAPILLARY: Glucose-Capillary: 92 mg/dL (ref 70–99)

## 2023-10-17 MED ORDER — FLUDEOXYGLUCOSE F - 18 (FDG) INJECTION
9.1000 | Freq: Once | INTRAVENOUS | Status: AC | PRN
  Administered 2023-10-17: 9.74 via INTRAVENOUS

## 2024-01-11 ENCOUNTER — Inpatient Hospital Stay: Payer: Medicare Other | Attending: Internal Medicine

## 2024-01-11 ENCOUNTER — Inpatient Hospital Stay (HOSPITAL_BASED_OUTPATIENT_CLINIC_OR_DEPARTMENT_OTHER): Payer: Medicare Other | Admitting: Internal Medicine

## 2024-01-11 ENCOUNTER — Inpatient Hospital Stay: Payer: Medicare Other

## 2024-01-11 VITALS — BP 169/61 | HR 65 | Temp 97.0°F | Resp 16 | Wt 175.0 lb

## 2024-01-11 VITALS — BP 151/42 | HR 66

## 2024-01-11 DIAGNOSIS — Z85820 Personal history of malignant melanoma of skin: Secondary | ICD-10-CM | POA: Insufficient documentation

## 2024-01-11 DIAGNOSIS — D649 Anemia, unspecified: Secondary | ICD-10-CM

## 2024-01-11 DIAGNOSIS — E538 Deficiency of other specified B group vitamins: Secondary | ICD-10-CM | POA: Insufficient documentation

## 2024-01-11 LAB — IRON AND TIBC
Iron: 69 ug/dL (ref 28–170)
Saturation Ratios: 24 % (ref 10.4–31.8)
TIBC: 288 ug/dL (ref 250–450)
UIBC: 219 ug/dL

## 2024-01-11 LAB — CBC (CANCER CENTER ONLY)
HCT: 28.8 % — ABNORMAL LOW (ref 36.0–46.0)
Hemoglobin: 9 g/dL — ABNORMAL LOW (ref 12.0–15.0)
MCH: 30.8 pg (ref 26.0–34.0)
MCHC: 31.3 g/dL (ref 30.0–36.0)
MCV: 98.6 fL (ref 80.0–100.0)
Platelet Count: 226 10*3/uL (ref 150–400)
RBC: 2.92 MIL/uL — ABNORMAL LOW (ref 3.87–5.11)
RDW: 13.3 % (ref 11.5–15.5)
WBC Count: 7.5 10*3/uL (ref 4.0–10.5)
nRBC: 0 % (ref 0.0–0.2)

## 2024-01-11 LAB — BASIC METABOLIC PANEL - CANCER CENTER ONLY
Anion gap: 6 (ref 5–15)
BUN: 30 mg/dL — ABNORMAL HIGH (ref 8–23)
CO2: 23 mmol/L (ref 22–32)
Calcium: 9.1 mg/dL (ref 8.9–10.3)
Chloride: 103 mmol/L (ref 98–111)
Creatinine: 1.25 mg/dL — ABNORMAL HIGH (ref 0.44–1.00)
GFR, Estimated: 43 mL/min — ABNORMAL LOW (ref >60)
Glucose, Bld: 95 mg/dL (ref 70–99)
Potassium: 4 mmol/L (ref 3.5–5.1)
Sodium: 132 mmol/L — ABNORMAL LOW (ref 135–145)

## 2024-01-11 LAB — VITAMIN B12: Vitamin B-12: 406 pg/mL (ref 180–914)

## 2024-01-11 LAB — FERRITIN: Ferritin: 220 ng/mL (ref 11–307)

## 2024-01-11 MED ORDER — SODIUM CHLORIDE 0.9% FLUSH
10.0000 mL | Freq: Once | INTRAVENOUS | Status: DC | PRN
  Filled 2024-01-11: qty 10

## 2024-01-11 MED ORDER — IRON SUCROSE 20 MG/ML IV SOLN
200.0000 mg | Freq: Once | INTRAVENOUS | Status: AC
  Administered 2024-01-11: 200 mg via INTRAVENOUS
  Filled 2024-01-11: qty 10

## 2024-01-11 NOTE — Progress Notes (Signed)
 [facility] CONSULT NOTE  Patient Care Team: Danella Penton, MD as PCP - General (Internal Medicine) Earna Coder, MD as Consulting Physician (Hematology)  CHIEF COMPLAINTS/PURPOSE OF CONSULTATION: ANEMIA  HEMATOLOGY HISTORY:  # CHRONIC ANEMIA [2019- Hb-10]; JAN 2023- 9.6; GFR ~40s; EGD-; colonoscopy-2010 [normal; Dr.Skulskie];  CKD versus others.  Kappa lambda light chain ratio slightly abnormal-likely secondary insufficiency; M protein negative.  Clinically not suggestive of multiple myeloma.  Would not recommend a bone marrow biopsy at this time.  # CKD-stage III/chronic Hyponatremia: -[109 in past- [facility]] ?  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  Ambulating independently.  Nicole Shaw 83 y.o.  female with chronic anemia sec to CKD-III is here for a follow up.  pet scan was done 10/17/2023 for melanoma. Patient had her toe amputated on 11/01/2023 for melanoma.  Patient complains of ongoing fatigue.  Denies any worsening shortness of breath or cough. No blood in stools.    Review of Systems  Constitutional:  Positive for malaise/fatigue. Negative for chills, diaphoresis, fever and weight loss.  HENT:  Negative for nosebleeds and sore throat.   Eyes:  Negative for double vision.  Respiratory:  Negative for cough, hemoptysis, sputum production, shortness of breath and wheezing.   Cardiovascular:  Negative for chest pain, palpitations, orthopnea and leg swelling.  Gastrointestinal:  Negative for abdominal pain, blood in stool, constipation, diarrhea, heartburn, melena, nausea and vomiting.  Genitourinary:  Negative for dysuria, frequency and urgency.  Musculoskeletal:  Positive for back pain and joint pain.  Skin: Negative.  Negative for  itching and rash.  Neurological:  Negative for dizziness, tingling, focal weakness, weakness and headaches.  Endo/Heme/Allergies:  Does not bruise/bleed easily.  Psychiatric/Behavioral:  Negative for depression. The patient is not nervous/anxious and does not have insomnia.     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)    Panic attacks    Pernicious anemia     SURGICAL HISTORY: Past Surgical History:  Procedure Laterality Date   CATARACT EXTRACTION W/PHACO Left 05/19/2021   Procedure: CATARACT EXTRACTION PHACO AND INTRAOCULAR LENS PLACEMENT (IOC) LEFT  DIABETIC VIVITY LENS 7.37 01:04.6;  Surgeon: Galen Manila, MD;  Location: [facility];  Service: Ophthalmology;  Laterality: Left;   CATARACT EXTRACTION W/PHACO Right 06/02/2021   Procedure: CATARACT EXTRACTION PHACO AND INTRAOCULAR LENS PLACEMENT (IOC) RIGHT DIABETIC VIVITY LENS 10.95 01:06.0;  Surgeon: Galen Manila, MD;  Location: [facility];  Service: Ophthalmology;  Laterality: Right;   KNEE SURGERY     THYROIDECTOMY      SOCIAL HISTORY: Social History   Socioeconomic History   Marital status: Single    Spouse name: Not on file   Number of children: Not on file   Years of education: Not on file  Highest education level: Not on file  Occupational History   Not on file  Tobacco Use   Smoking status: Never    Passive exposure: Never   Smokeless tobacco: Never  Vaping Use   Vaping status: Never Used  Substance and Sexual Activity    Alcohol use: Never   Drug use: Never   Sexual activity: Not on file  Other Topics Concern   Not on file  Social History Narrative   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    Social Drivers of Corporate investment banker Strain: Low Risk  (11/16/2023)   Received from [facility]   Overall Financial Resource Strain (CARDIA)    Difficulty of Paying Living Expenses: Not hard at all  Food Insecurity: No Food Insecurity (11/16/2023)   Received from [facility]   Hunger Vital Sign    Worried About Running Out of Food in the Last Year: Never true    Ran Out of Food in the Last Year: Never true  Transportation Needs: No Transportation Needs (11/16/2023)   Received from [facility]    In the past 12 months, has lack of transportation kept you from medical appointments or from getting medications?: No    Lack of Transportation (Non-Medical): No  Physical Activity: Not on file  Stress: Not on file  Social Connections: Not on file  Intimate Partner Violence: Not on file    FAMILY HISTORY: Family History  Problem Relation Age of Onset   Diabetes Mother    Heart attack Mother    Osteoporosis Mother    Stroke Mother    Ulcers Father    COPD Father    Breast cancer Sister 15   Diabetes Sister    Breast cancer Maternal Aunt     ALLERGIES:  is allergic to ace inhibitors, penicillins, sulfa antibiotics, trazodone, and amlodipine.  MEDICATIONS:  Current Outpatient Medications  Medication Sig Dispense Refill   Carboxymeth-Glyc-Polysorb PF (REFRESH OPTIVE MEGA-3) 0.5-1-0.5 % SOLN Apply to eye.     Cholecalciferol (VITAMIN D3) 2000 units capsule Take 1 capsule by mouth daily.     clobetasol cream (TEMOVATE) 0.05 % Apply 1 application topically 2 (two) times daily.     Cyanocobalamin (VITAMIN B-12 IJ) Inject as directed every 30 (thirty) days.     DOCUSATE SODIUM PO Take by mouth.      doxazosin (CARDURA) 2 MG tablet Take 2 mg by mouth daily.     escitalopram (LEXAPRO) 5 MG tablet Take 10 mg by mouth daily.     famotidine (PEPCID) 40 MG tablet Take 40 mg by mouth at bedtime.     levothyroxine (SYNTHROID, LEVOTHROID) 100 MCG tablet Take 1 tablet by mouth daily.     loperamide (IMODIUM A-D) 2 MG tablet Take by mouth.     loratadine (CLARITIN) 10 MG tablet Take 1 tablet by mouth daily.     LORazepam (ATIVAN) 0.5 MG tablet Take by mouth.     pantoprazole (PROTONIX) 40 MG tablet Take 40 mg by mouth daily.     simvastatin (ZOCOR) 10 MG tablet Take 1 tablet by mouth daily.     telmisartan-hydrochlorothiazide (MICARDIS HCT) 80-12.5 MG tablet Take 1 tablet by mouth daily. Take 1/2 tablet qd     triamcinolone cream (KENALOG) 0.1 % Apply 1 Application topically 2 (two) times daily.     metoprolol succinate (TOPROL-XL) 50 MG 24 hr tablet Take 25 mg by  mouth daily. Take 1 every morning and 1/2 qhs     No current facility-administered medications for this visit.   Facility-Administered Medications Ordered in Other Visits  Medication Dose Route Frequency Provider Last Rate Last Admin   iron sucrose (VENOFER) injection 200 mg  200 mg Intravenous Once Rickard Patience, MD       sodium chloride flush (NS) 0.9 % injection 10 mL  10 mL Intracatheter Once PRN Earna Coder, MD          PHYSICAL EXAMINATION:   Vitals:   01/11/24 1306  BP: (!) 169/61  Pulse: 65  Resp: 16  Temp: (!) 97 F (36.1 C)  SpO2: 97%    Filed Weights   01/11/24 1306  Weight: 175 lb (79.4 kg)     Physical Exam Vitals and nursing note reviewed.  HENT:     Head: Normocephalic and atraumatic.     Mouth/Throat:     Pharynx: Oropharynx is clear.  Eyes:     Extraocular Movements: Extraocular movements intact.     Pupils: Pupils are equal, round, and reactive to light.  Cardiovascular:     Rate and Rhythm: Normal rate and regular rhythm.  Pulmonary:     Comments: Decreased breath sounds bilaterally.   Abdominal:     Palpations: Abdomen is soft.  Musculoskeletal:        General: Normal range of motion.     Cervical back: Normal range of motion.  Skin:    General: Skin is warm.  Neurological:     General: No focal deficit present.     Mental Status: She is alert and oriented to person, place, and time.  Psychiatric:        Behavior: Behavior normal.        Judgment: Judgment normal.     LABORATORY DATA:  I have reviewed the data as listed Lab Results  Component Value Date   WBC 7.5 01/11/2024   HGB 9.0 (L) 01/11/2024   HCT 28.8 (L) 01/11/2024   MCV 98.6 01/11/2024   PLT 226 01/11/2024   Recent Labs    07/11/23 1436 01/11/24 1236  NA 132* 132*  K 4.2 4.0  CL 99 103  CO2 23 23  GLUCOSE 103* 95  BUN 26* 30*  CREATININE 1.39* 1.25*  CALCIUM 9.4 9.1  GFRNONAA 38* 43*     No results found.  Symptomatic anemia # Chronic mild anemia recent worsening Jan 2023-hemoglobin 9-10; not on oral iron.  Poor tolerance. JAN 2024- Iron sat- 28%; Ferritin-248. S/p venofer- Hb improved ~11-  HOLD with venofer today.  Discuss need for possible Retacrit/boosters.  #Etiology of anemia- ?  CKD versus others.  Kappa lambda light chain ratio slightly abnormal-likely secondary insufficiency; M protein negative-   # CKD-stage III/chronic Hyponatremia: - ?  Etiology- recommend gatorade; limit free water/salt tablets []   Avoid motrin. [? Rib pain- 1 a day]- stable.   # melanoma- s/p toe amputated on 11/01/2023 for [[facility]]- RESIDUAL MELANOMA IN-SITU WITH ASSOCIATED HEALED SURGICAL WOUND, MARGINS ARE NEGATIVE FOR MELANOMA.- No definitive residual invasive melanoma- continue follow up with dermatology.   # Elevated BP- recommend checking at home; and follow up with PCP-   # B12 def [2019; PCP]-on B12 injections. JAN 2023- 1200- defer to PCP.   Labs printed # DISPOSITION: # venofer today # follow up in 3  months MD; lab- cbc/bmp; iron studies/ferritin; b12 levels  possible venofer--  Dr.B  All questions were answered. The patient knows to call  the clinic with any problems, questions or concerns.    Earna Coder, MD 01/11/2024 1:58 PM

## 2024-01-11 NOTE — Progress Notes (Signed)
 Patient had her toe amputated on 11/01/2023, pet scan was done 10/17/2023. She follows up with her dermatologist every 6 months for the next 5 years. She said that she feels good. 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

## 2024-01-11 NOTE — Assessment & Plan Note (Addendum)
#   Chronic mild anemia recent worsening Jan 2023-hemoglobin 9-10; not on oral iron.  Poor tolerance. JAN 2024- Iron sat- 28%; Ferritin-248. S/p venofer- Hb improved ~11-  HOLD with venofer today.  Discuss need for possible Retacrit/boosters.  #Etiology of anemia- ?  CKD versus others.  Kappa lambda light chain ratio slightly abnormal-likely secondary insufficiency; M protein negative-   # CKD-stage III/chronic Hyponatremia: - ?  Etiology- recommend gatorade; limit free water/salt tablets []   Avoid motrin. [? Rib pain- 1 a day]- stable.   # melanoma- s/p toe amputated on 11/01/2023 for [[facility]]- RESIDUAL MELANOMA IN-SITU WITH ASSOCIATED HEALED SURGICAL WOUND, MARGINS ARE NEGATIVE FOR MELANOMA.- No definitive residual invasive melanoma- continue follow up with dermatology.   # Elevated BP- recommend checking at home; and follow up with PCP-   # B12 def [2019; PCP]-on B12 injections. JAN 2023- 1200- defer to PCP.   Labs printed # DISPOSITION: # venofer today # follow up in 3  months MD; lab- cbc/bmp; iron studies/ferritin; b12 levels  possible venofer-- Dr.B

## 2024-01-11 NOTE — Patient Instructions (Signed)

## 2024-01-12 ENCOUNTER — Telehealth: Payer: Self-pay | Admitting: *Deleted

## 2024-01-12 NOTE — Telephone Encounter (Signed)
 He patient called to get labs to be put in the mail. The staff did not give it to her and the AVS did not have the wt, or b/p this time. I told her that we are now getting the labs from med. Rec,. I called Zella Ball and she will send it to her through  the mail per peson.

## 2024-04-11 ENCOUNTER — Inpatient Hospital Stay: Attending: Internal Medicine

## 2024-04-11 ENCOUNTER — Encounter: Payer: Self-pay | Admitting: Internal Medicine

## 2024-04-11 ENCOUNTER — Inpatient Hospital Stay (HOSPITAL_BASED_OUTPATIENT_CLINIC_OR_DEPARTMENT_OTHER): Admitting: Internal Medicine

## 2024-04-11 ENCOUNTER — Inpatient Hospital Stay

## 2024-04-11 VITALS — BP 140/55 | HR 69 | Temp 98.0°F | Resp 12 | Ht 62.0 in | Wt 179.5 lb

## 2024-04-11 VITALS — BP 150/64

## 2024-04-11 DIAGNOSIS — Z85820 Personal history of malignant melanoma of skin: Secondary | ICD-10-CM | POA: Diagnosis not present

## 2024-04-11 DIAGNOSIS — D649 Anemia, unspecified: Secondary | ICD-10-CM

## 2024-04-11 DIAGNOSIS — N184 Chronic kidney disease, stage 4 (severe): Secondary | ICD-10-CM | POA: Insufficient documentation

## 2024-04-11 DIAGNOSIS — D631 Anemia in chronic kidney disease: Secondary | ICD-10-CM | POA: Insufficient documentation

## 2024-04-11 DIAGNOSIS — E538 Deficiency of other specified B group vitamins: Secondary | ICD-10-CM | POA: Diagnosis present

## 2024-04-11 LAB — BASIC METABOLIC PANEL WITH GFR
Anion gap: 7 (ref 5–15)
BUN: 29 mg/dL — ABNORMAL HIGH (ref 8–23)
CO2: 24 mmol/L (ref 22–32)
Calcium: 9 mg/dL (ref 8.9–10.3)
Chloride: 105 mmol/L (ref 98–111)
Creatinine, Ser: 1.54 mg/dL — ABNORMAL HIGH (ref 0.44–1.00)
GFR, Estimated: 33 mL/min — ABNORMAL LOW (ref >60)
Glucose, Bld: 102 mg/dL — ABNORMAL HIGH (ref 70–99)
Potassium: 4.5 mmol/L (ref 3.5–5.1)
Sodium: 136 mmol/L (ref 135–145)

## 2024-04-11 LAB — CBC WITH DIFFERENTIAL (CANCER CENTER ONLY)
Abs Immature Granulocytes: 0.02 10*3/uL (ref 0.00–0.07)
Basophils Absolute: 0 10*3/uL (ref 0.0–0.1)
Basophils Relative: 1 %
Eosinophils Absolute: 0.1 10*3/uL (ref 0.0–0.5)
Eosinophils Relative: 2 %
HCT: 27.8 % — ABNORMAL LOW (ref 36.0–46.0)
Hemoglobin: 8.9 g/dL — ABNORMAL LOW (ref 12.0–15.0)
Immature Granulocytes: 0 %
Lymphocytes Relative: 26 %
Lymphs Abs: 2 10*3/uL (ref 0.7–4.0)
MCH: 30.8 pg (ref 26.0–34.0)
MCHC: 32 g/dL (ref 30.0–36.0)
MCV: 96.2 fL (ref 80.0–100.0)
Monocytes Absolute: 0.8 10*3/uL (ref 0.1–1.0)
Monocytes Relative: 10 %
Neutro Abs: 4.8 10*3/uL (ref 1.7–7.7)
Neutrophils Relative %: 61 %
Platelet Count: 204 10*3/uL (ref 150–400)
RBC: 2.89 MIL/uL — ABNORMAL LOW (ref 3.87–5.11)
RDW: 12.5 % (ref 11.5–15.5)
WBC Count: 7.8 10*3/uL (ref 4.0–10.5)
nRBC: 0 % (ref 0.0–0.2)

## 2024-04-11 LAB — FERRITIN: Ferritin: 173 ng/mL (ref 11–307)

## 2024-04-11 LAB — VITAMIN B12: Vitamin B-12: 517 pg/mL (ref 180–914)

## 2024-04-11 LAB — IRON AND TIBC
Iron: 50 ug/dL (ref 28–170)
Saturation Ratios: 19 % (ref 10.4–31.8)
TIBC: 267 ug/dL (ref 250–450)
UIBC: 217 ug/dL

## 2024-04-11 MED ORDER — IRON SUCROSE 20 MG/ML IV SOLN
200.0000 mg | Freq: Once | INTRAVENOUS | Status: AC
  Administered 2024-04-11: 200 mg via INTRAVENOUS

## 2024-04-11 NOTE — Progress Notes (Signed)
 [facility] CONSULT NOTE  Patient Care Team: Sari Cunning, MD as PCP - General (Internal Medicine) Gwyn Leos, MD as Consulting Physician (Hematology)  CHIEF COMPLAINTS/PURPOSE OF CONSULTATION: ANEMIA  HEMATOLOGY HISTORY:  # CHRONIC ANEMIA [2019- Hb-10]; JAN 2023- 9.6; GFR ~40s; EGD-; colonoscopy-2010 [normal; Dr.Skulskie];  CKD versus others.  Kappa lambda light chain ratio slightly abnormal-likely secondary insufficiency; M protein negative.  Clinically not suggestive of multiple myeloma.  Would not recommend a bone marrow biopsy at this time.  # CKD-stage III/chronic Hyponatremia: -[109 in past- [facility]] ?  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  Ambulating independently.  Nicole Shaw 83 y.o.  female with chronic anemia sec to CKD-IIIl; Hx- right toe melanoma s/p surgery is here for a follow up.  Patient complains of ongoing fatigue.  Admits to worsening shortness of breath or cough. No blood in stools. Not on Oral iron  sec to poor tolerance.   Review of Systems  Constitutional:  Positive for malaise/fatigue. Negative for chills, diaphoresis, fever and weight loss.  HENT:  Negative for nosebleeds and sore throat.   Eyes:  Negative for double vision.  Respiratory:  Negative for cough, hemoptysis, sputum production, shortness of breath and wheezing.   Cardiovascular:  Negative for chest pain, palpitations, orthopnea and leg swelling.  Gastrointestinal:  Negative for abdominal pain, blood in stool, constipation, diarrhea, heartburn, melena, nausea and vomiting.  Genitourinary:  Negative for dysuria, frequency and urgency.  Musculoskeletal:  Positive for back pain and joint pain.  Skin: Negative.  Negative for itching and rash.   Neurological:  Negative for dizziness, tingling, focal weakness, weakness and headaches.  Endo/Heme/Allergies:  Does not bruise/bleed easily.  Psychiatric/Behavioral:  Negative for depression. The patient is not nervous/anxious and does not have insomnia.     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)    Panic attacks    Pernicious anemia     SURGICAL HISTORY: Past Surgical History:  Procedure Laterality Date   CATARACT EXTRACTION W/PHACO Left 05/19/2021   Procedure: CATARACT EXTRACTION PHACO AND INTRAOCULAR LENS PLACEMENT (IOC) LEFT  DIABETIC VIVITY LENS 7.37 01:04.6;  Surgeon: Clair Crews, MD;  Location: [facility];  Service: Ophthalmology;  Laterality: Left;   CATARACT EXTRACTION W/PHACO Right 06/02/2021   Procedure: CATARACT EXTRACTION PHACO AND INTRAOCULAR LENS PLACEMENT (IOC) RIGHT DIABETIC VIVITY LENS 10.95 01:06.0;  Surgeon: Clair Crews, MD;  Location: [facility];  Service: Ophthalmology;  Laterality: Right;   KNEE SURGERY     THYROIDECTOMY      SOCIAL HISTORY: Social History   Socioeconomic History   Marital status: Single    Spouse name: Not on file   Number of children: Not on file   Years of education: Not on file   Highest education  level: Not on file  Occupational History   Not on file  Tobacco Use   Smoking status: Never    Passive exposure: Never   Smokeless tobacco: Never  Vaping Use   Vaping status: Never Used  Substance and Sexual Activity   Alcohol use: Never    Drug use: Never   Sexual activity: Not on file  Other Topics Concern   Not on file  Social History Narrative   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    Social Drivers of Corporate investment banker Strain: Low Risk  (11/16/2023)   Received from [facility]   Overall Financial Resource Strain (CARDIA)    Difficulty of Paying Living Expenses: Not hard at all  Food Insecurity: No Food Insecurity (11/16/2023)   Received from [facility]   Hunger Vital Sign    Within the past 12 months, you worried that your food would run out before you got the money to buy more.: Never true    Within the past 12 months, the food you bought just didn't last and you didn't have money to get more.: Never true  Transportation Needs: No Transportation Needs (11/16/2023)   Received from [facility]    In the past 12 months, has lack of transportation kept you from medical appointments or from getting medications?: No    Lack of Transportation (Non-Medical): No  Physical Activity: Not on file  Stress: Not on file  Social Connections: Not on file  Intimate Partner Violence: Not on file    FAMILY HISTORY: Family History  Problem Relation Age of Onset   Diabetes Mother    Heart attack Mother    Osteoporosis Mother    Stroke Mother    Ulcers Father    COPD Father    Breast cancer Sister 97   Diabetes Sister    Breast cancer Maternal Aunt     ALLERGIES:  is allergic to ace inhibitors, penicillins, sulfa antibiotics, trazodone, and amlodipine.  MEDICATIONS:  Current Outpatient Medications  Medication Sig Dispense Refill   Carboxymeth-Glyc-Polysorb PF (REFRESH OPTIVE MEGA-3) 0.5-1-0.5 % SOLN Apply to eye.     Cholecalciferol  (VITAMIN D3) 2000 units capsule Take 1 capsule by mouth daily.     clobetasol cream (TEMOVATE) 0.05 % Apply 1 application topically 2 (two) times daily. (Patient taking  differently: Apply 1 application  topically 2 (two) times a week.)     Cyanocobalamin  (VITAMIN B-12 IJ) Inject as directed every 30 (thirty) days.     doxazosin (CARDURA) 2 MG tablet Take 2 mg by mouth daily.     escitalopram (LEXAPRO) 5 MG tablet Take 10 mg by mouth daily. (Patient taking differently: Take 2.5 mg by mouth daily.)     famotidine (PEPCID) 40 MG tablet Take 40 mg by mouth at bedtime.     levothyroxine  (SYNTHROID , LEVOTHROID) 100 MCG tablet Take 1 tablet by mouth daily.     loperamide (IMODIUM A-D) 2 MG tablet Take by mouth.     loratadine  (CLARITIN ) 10 MG tablet Take 1 tablet by mouth daily.     LORazepam  (ATIVAN ) 0.5 MG tablet Take by mouth. (Patient taking differently: Take 0.5 mg by mouth daily as needed for anxiety.)     metoprolol  succinate (TOPROL -XL) 50 MG 24 hr tablet Take 25 mg by mouth daily. Take 1 every morning and 1/2 qhs (Patient taking differently: Take 50 mg by mouth daily.)  pantoprazole  (PROTONIX ) 40 MG tablet Take 40 mg by mouth daily.     simvastatin  (ZOCOR ) 10 MG tablet Take 1 tablet by mouth daily.     telmisartan-hydrochlorothiazide (MICARDIS HCT) 80-12.5 MG tablet Take 1 tablet by mouth daily. Take 1/2 tablet qd     triamcinolone cream (KENALOG) 0.1 % Apply 1 Application topically 2 (two) times daily.     DOCUSATE SODIUM PO Take by mouth. (Patient not taking: Reported on 04/11/2024)     No current facility-administered medications for this visit.   Facility-Administered Medications Ordered in Other Visits  Medication Dose Route Frequency Provider Last Rate Last Admin   iron  sucrose (VENOFER ) injection 200 mg  200 mg Intravenous Once Timmy Forbes, MD          PHYSICAL EXAMINATION:   Vitals:   04/11/24 1252  BP: (!) 140/55  Pulse: 69  Resp: 12  Temp: 98 F (36.7 C)  SpO2: 97%    Filed Weights   04/11/24 1252  Weight: 179 lb 8 oz (81.4 kg)     Physical Exam Vitals and nursing note reviewed.  HENT:     Head: Normocephalic and atraumatic.      Mouth/Throat:     Pharynx: Oropharynx is clear.   Eyes:     Extraocular Movements: Extraocular movements intact.     Pupils: Pupils are equal, round, and reactive to light.    Cardiovascular:     Rate and Rhythm: Normal rate and regular rhythm.     Heart sounds: Murmur heard.  Pulmonary:     Comments: Decreased breath sounds bilaterally.  Abdominal:     Palpations: Abdomen is soft.   Musculoskeletal:        General: Normal range of motion.     Cervical back: Normal range of motion.   Skin:    General: Skin is warm.   Neurological:     General: No focal deficit present.     Mental Status: She is alert and oriented to person, place, and time.   Psychiatric:        Behavior: Behavior normal.        Judgment: Judgment normal.     LABORATORY DATA:  I have reviewed the data as listed Lab Results  Component Value Date   WBC 7.8 04/11/2024   HGB 8.9 (L) 04/11/2024   HCT 27.8 (L) 04/11/2024   MCV 96.2 04/11/2024   PLT 204 04/11/2024   Recent Labs    07/11/23 1436 01/11/24 1236 04/11/24 1250  NA 132* 132* 136  K 4.2 4.0 4.5  CL 99 103 105  CO2 23 23 24   GLUCOSE 103* 95 102*  BUN 26* 30* 29*  CREATININE 1.39* 1.25* 1.54*  CALCIUM 9.4 9.1 9.0  GFRNONAA 38* 43* 33*     No results found.  Symptomatic anemia # Chronic mild anemia recent worsening Jan 2023-hemoglobin 9-10; not on oral iron .  Poor tolerance.  S/p venofer - Hb improved ;  MARCH 2025 Iron  sat- 24%; Ferritin-248. But Today- Hb 8.2 -  proceed with venofer  today. Discussed use of erythropoietin  stimulating agents like retacrit to stimulate the bone marrow.  Discussed the potential issues with erythropoietin  estimating agents-given the risk of stroke thromboembolic events/elevated blood pressure.  However, most of the serious events did not happen when the goal hematocrit is 33/hemoglobin 11  I also discussed the use of concurrent IV iron  infusions.   #Etiology of anemia- ?  CKD versus others.  Kappa  lambda light chain ratio slightly abnormal-likely  secondary insufficiency; M protein negative-   # CKD-stage III/chronic Hyponatremia: - ?  Etiology- recommend gatorade; limit free water/salt tablets []   Avoid motrin. [? Rib pain- 1 a day]- stable.   # melanoma- s/p toe amputated on 11/01/2023 for [[facility]]- RESIDUAL MELANOMA IN-SITU WITH ASSOCIATED HEALED SURGICAL WOUND, MARGINS ARE NEGATIVE FOR MELANOMA.- No definitive residual invasive melanoma- continue follow up with dermatology.   # Elevated BP- recommend checking at home; and follow up with PCP-   # B12 def [2019; PCP]-on B12 injections.- every month per PCP>    # DISPOSITION: # venofer  today # retacrit early next week- first shot # monthly x2 more labs- H&H- Possible retacrit  # follow up in 3 months MD; lab- cbc/bmp; iron  studies/ferritin; b12 levels  possible venofer  or retacrit -- Dr.B   All questions were answered. The patient knows to call the clinic with any problems, questions or concerns.    Gwyn Leos, MD 04/11/2024 1:56 PM

## 2024-04-11 NOTE — Progress Notes (Signed)
 Fatigue/weakness: YES Dyspena: DOE Light headedness: NO  Blood in stool: NO

## 2024-04-11 NOTE — Assessment & Plan Note (Signed)
#   Chronic mild anemia recent worsening Jan 2023-hemoglobin 9-10; not on oral iron .  Poor tolerance.  S/p venofer - Hb improved ;  MARCH 2025 Iron  sat- 24%; Ferritin-248. But Today- Hb 8.2 -  proceed with venofer  today. Discussed use of erythropoietin  stimulating agents like retacrit to stimulate the bone marrow.  Discussed the potential issues with erythropoietin  estimating agents-given the risk of stroke thromboembolic events/elevated blood pressure.  However, most of the serious events did not happen when the goal hematocrit is 33/hemoglobin 11  I also discussed the use of concurrent IV iron  infusions.   #Etiology of anemia- ?  CKD versus others.  Kappa lambda light chain ratio slightly abnormal-likely secondary insufficiency; M protein negative-   # CKD-stage III/chronic Hyponatremia: - ?  Etiology- recommend gatorade; limit free water/salt tablets []   Avoid motrin. [? Rib pain- 1 a day]- stable.   # melanoma- s/p toe amputated on 11/01/2023 for [[facility]]- RESIDUAL MELANOMA IN-SITU WITH ASSOCIATED HEALED SURGICAL WOUND, MARGINS ARE NEGATIVE FOR MELANOMA.- No definitive residual invasive melanoma- continue follow up with dermatology.   # Elevated BP- recommend checking at home; and follow up with PCP-   # B12 def [2019; PCP]-on B12 injections.- every month per PCP>    # DISPOSITION: # venofer  today # retacrit early next week- first shot # monthly x2 more labs- H&H- Possible retacrit  # follow up in 3 months MD; lab- cbc/bmp; iron  studies/ferritin; b12 levels  possible venofer  or retacrit -- Dr.B

## 2024-04-16 ENCOUNTER — Inpatient Hospital Stay

## 2024-04-16 VITALS — BP 143/49

## 2024-04-16 DIAGNOSIS — D649 Anemia, unspecified: Secondary | ICD-10-CM

## 2024-04-16 DIAGNOSIS — N184 Chronic kidney disease, stage 4 (severe): Secondary | ICD-10-CM | POA: Diagnosis not present

## 2024-04-16 MED ORDER — EPOETIN ALFA-EPBX 20000 UNIT/ML IJ SOLN
20000.0000 [IU] | Freq: Once | INTRAMUSCULAR | Status: AC
  Administered 2024-04-16: 20000 [IU] via SUBCUTANEOUS
  Filled 2024-04-16: qty 1

## 2024-05-14 ENCOUNTER — Inpatient Hospital Stay

## 2024-05-14 ENCOUNTER — Inpatient Hospital Stay: Attending: Internal Medicine

## 2024-05-14 VITALS — BP 161/75

## 2024-05-14 DIAGNOSIS — D631 Anemia in chronic kidney disease: Secondary | ICD-10-CM | POA: Insufficient documentation

## 2024-05-14 DIAGNOSIS — E538 Deficiency of other specified B group vitamins: Secondary | ICD-10-CM | POA: Insufficient documentation

## 2024-05-14 DIAGNOSIS — N184 Chronic kidney disease, stage 4 (severe): Secondary | ICD-10-CM | POA: Diagnosis present

## 2024-05-14 DIAGNOSIS — D649 Anemia, unspecified: Secondary | ICD-10-CM

## 2024-05-14 LAB — HEMOGLOBIN AND HEMATOCRIT (CANCER CENTER ONLY)
HCT: 29.6 % — ABNORMAL LOW (ref 36.0–46.0)
Hemoglobin: 9.5 g/dL — ABNORMAL LOW (ref 12.0–15.0)

## 2024-05-14 MED ORDER — EPOETIN ALFA-EPBX 20000 UNIT/ML IJ SOLN
20000.0000 [IU] | Freq: Once | INTRAMUSCULAR | Status: AC
  Administered 2024-05-14: 20000 [IU] via SUBCUTANEOUS
  Filled 2024-05-14: qty 1

## 2024-05-21 ENCOUNTER — Inpatient Hospital Stay

## 2024-05-23 ENCOUNTER — Inpatient Hospital Stay

## 2024-06-14 ENCOUNTER — Inpatient Hospital Stay: Attending: Internal Medicine

## 2024-06-14 ENCOUNTER — Inpatient Hospital Stay

## 2024-06-14 VITALS — BP 146/50

## 2024-06-14 DIAGNOSIS — E538 Deficiency of other specified B group vitamins: Secondary | ICD-10-CM | POA: Insufficient documentation

## 2024-06-14 DIAGNOSIS — D649 Anemia, unspecified: Secondary | ICD-10-CM

## 2024-06-14 DIAGNOSIS — D631 Anemia in chronic kidney disease: Secondary | ICD-10-CM | POA: Insufficient documentation

## 2024-06-14 DIAGNOSIS — N184 Chronic kidney disease, stage 4 (severe): Secondary | ICD-10-CM | POA: Insufficient documentation

## 2024-06-14 LAB — HEMOGLOBIN AND HEMATOCRIT (CANCER CENTER ONLY)
HCT: 30.9 % — ABNORMAL LOW (ref 36.0–46.0)
Hemoglobin: 9.9 g/dL — ABNORMAL LOW (ref 12.0–15.0)

## 2024-06-14 MED ORDER — EPOETIN ALFA-EPBX 20000 UNIT/ML IJ SOLN
20000.0000 [IU] | Freq: Once | INTRAMUSCULAR | Status: AC
  Administered 2024-06-14: 20000 [IU] via SUBCUTANEOUS
  Filled 2024-06-14: qty 1

## 2024-07-11 ENCOUNTER — Inpatient Hospital Stay (HOSPITAL_BASED_OUTPATIENT_CLINIC_OR_DEPARTMENT_OTHER): Admitting: Internal Medicine

## 2024-07-11 ENCOUNTER — Encounter: Payer: Self-pay | Admitting: Internal Medicine

## 2024-07-11 ENCOUNTER — Inpatient Hospital Stay

## 2024-07-11 ENCOUNTER — Inpatient Hospital Stay: Attending: Internal Medicine

## 2024-07-11 VITALS — BP 179/65 | HR 65 | Temp 97.8°F | Resp 17 | Ht 62.0 in | Wt 174.4 lb

## 2024-07-11 VITALS — BP 173/63 | HR 64

## 2024-07-11 DIAGNOSIS — N184 Chronic kidney disease, stage 4 (severe): Secondary | ICD-10-CM | POA: Diagnosis present

## 2024-07-11 DIAGNOSIS — D631 Anemia in chronic kidney disease: Secondary | ICD-10-CM | POA: Diagnosis present

## 2024-07-11 DIAGNOSIS — E538 Deficiency of other specified B group vitamins: Secondary | ICD-10-CM | POA: Insufficient documentation

## 2024-07-11 DIAGNOSIS — D649 Anemia, unspecified: Secondary | ICD-10-CM

## 2024-07-11 LAB — CBC WITH DIFFERENTIAL (CANCER CENTER ONLY)
Abs Immature Granulocytes: 0.05 K/uL (ref 0.00–0.07)
Basophils Absolute: 0 K/uL (ref 0.0–0.1)
Basophils Relative: 0 %
Eosinophils Absolute: 0.1 K/uL (ref 0.0–0.5)
Eosinophils Relative: 1 %
HCT: 31.9 % — ABNORMAL LOW (ref 36.0–46.0)
Hemoglobin: 10.2 g/dL — ABNORMAL LOW (ref 12.0–15.0)
Immature Granulocytes: 1 %
Lymphocytes Relative: 30 %
Lymphs Abs: 2.2 K/uL (ref 0.7–4.0)
MCH: 30.6 pg (ref 26.0–34.0)
MCHC: 32 g/dL (ref 30.0–36.0)
MCV: 95.8 fL (ref 80.0–100.0)
Monocytes Absolute: 0.8 K/uL (ref 0.1–1.0)
Monocytes Relative: 11 %
Neutro Abs: 4.2 K/uL (ref 1.7–7.7)
Neutrophils Relative %: 57 %
Platelet Count: 224 K/uL (ref 150–400)
RBC: 3.33 MIL/uL — ABNORMAL LOW (ref 3.87–5.11)
RDW: 14 % (ref 11.5–15.5)
WBC Count: 7.3 K/uL (ref 4.0–10.5)
nRBC: 0 % (ref 0.0–0.2)

## 2024-07-11 LAB — IRON AND TIBC
Iron: 73 ug/dL (ref 28–170)
Saturation Ratios: 26 % (ref 10.4–31.8)
TIBC: 283 ug/dL (ref 250–450)
UIBC: 210 ug/dL

## 2024-07-11 LAB — BASIC METABOLIC PANEL WITH GFR
Anion gap: 6 (ref 5–15)
BUN: 33 mg/dL — ABNORMAL HIGH (ref 8–23)
CO2: 23 mmol/L (ref 22–32)
Calcium: 9.1 mg/dL (ref 8.9–10.3)
Chloride: 98 mmol/L (ref 98–111)
Creatinine, Ser: 1.42 mg/dL — ABNORMAL HIGH (ref 0.44–1.00)
GFR, Estimated: 36 mL/min — ABNORMAL LOW (ref >60)
Glucose, Bld: 97 mg/dL (ref 70–99)
Potassium: 4.3 mmol/L (ref 3.5–5.1)
Sodium: 127 mmol/L — ABNORMAL LOW (ref 135–145)

## 2024-07-11 LAB — FERRITIN: Ferritin: 243 ng/mL (ref 11–307)

## 2024-07-11 LAB — VITAMIN B12: Vitamin B-12: 424 pg/mL (ref 180–914)

## 2024-07-11 MED ORDER — SODIUM CHLORIDE 0.9% FLUSH
10.0000 mL | Freq: Once | INTRAVENOUS | Status: AC | PRN
  Administered 2024-07-11: 10 mL
  Filled 2024-07-11: qty 10

## 2024-07-11 MED ORDER — IRON SUCROSE 20 MG/ML IV SOLN
200.0000 mg | Freq: Once | INTRAVENOUS | Status: AC
  Administered 2024-07-11: 200 mg via INTRAVENOUS
  Filled 2024-07-11: qty 10

## 2024-07-11 NOTE — Progress Notes (Signed)
 [facility] CONSULT NOTE  Patient Care Team: Cleotilde Oneil FALCON, MD as PCP - General (Internal Medicine) Rennie Cindy SAUNDERS, MD as Consulting Physician (Hematology)  CHIEF COMPLAINTS/PURPOSE OF CONSULTATION: ANEMIA  HEMATOLOGY HISTORY:  # CHRONIC ANEMIA [2019- Hb-10]; JAN 2023- 9.6; GFR ~40s; EGD-; colonoscopy-2010 [normal; Dr.Skulskie];  CKD versus others.  Kappa lambda light chain ratio slightly abnormal-likely secondary insufficiency; M protein negative.  Clinically not suggestive of multiple myeloma.  Would not recommend a bone marrow biopsy at this time.  # CKD-stage III/chronic Hyponatremia: -[109 in past- [name]] ?  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  Ambulating independently.  Nicole Shaw 83 y.o.  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  Patient complains of ongoing left knee pain-history of Baker's cyst.   Patient complains of ongoing fatigue.denies any worsening shortness of breath or cough. No blood in stools. Not on Oral iron  sec to poor tolerance.   Review of Systems  Constitutional:  Positive for malaise/fatigue. Negative for chills, diaphoresis, fever and weight loss.  HENT:  Negative for nosebleeds and sore throat.   Eyes:  Negative for double vision.  Respiratory:  Negative for cough, hemoptysis, sputum production, shortness of breath and wheezing.   Cardiovascular:  Negative for chest pain, palpitations, orthopnea and leg swelling.  Gastrointestinal:  Negative for abdominal pain, blood in stool, constipation, diarrhea, heartburn, melena, nausea and vomiting.  Genitourinary:  Negative for dysuria, frequency and urgency.  Musculoskeletal:  Positive  for back pain and joint pain.  Skin: Negative.  Negative for itching and rash.  Neurological:  Negative for dizziness, tingling, focal weakness, weakness and headaches.  Endo/Heme/Allergies:  Does not bruise/bleed easily.  Psychiatric/Behavioral:  Negative for depression. The patient is not nervous/anxious and does not have insomnia.     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)    Panic attacks    Pernicious anemia     SURGICAL HISTORY: Past Surgical History:  Procedure Laterality Date   CATARACT EXTRACTION W/PHACO Left 05/19/2021   Procedure: CATARACT EXTRACTION PHACO AND INTRAOCULAR LENS PLACEMENT (IOC) LEFT  DIABETIC VIVITY LENS 7.37 01:04.6;  Surgeon: Jaye Fallow, MD;  Location: [facility];  Service: Ophthalmology;  Laterality: Left;   CATARACT EXTRACTION W/PHACO Right 06/02/2021   Procedure: CATARACT EXTRACTION PHACO AND INTRAOCULAR LENS PLACEMENT (IOC) RIGHT DIABETIC VIVITY LENS 10.95 01:06.0;  Surgeon: Jaye Fallow, MD;  Location: [facility];  Service: Ophthalmology;  Laterality: Right;   KNEE SURGERY     THYROIDECTOMY      SOCIAL HISTORY: Social History   Socioeconomic History   Marital status: Single    Spouse name: Not on file   Number of children: Not on file  Years of education: Not on file   Highest education level: Not on file  Occupational History   Not on file  Tobacco Use   Smoking status: Never    Passive exposure: Never   Smokeless tobacco: Never  Vaping Use    Vaping status: Never Used  Substance and Sexual Activity   Alcohol use: Never   Drug use: Never   Sexual activity: Not on file  Other Topics Concern   Not on file  Social History Narrative   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    Social Drivers of Corporate investment banker Strain: Low Risk  (05/25/2024)   Received from [facility]   Overall Financial Resource Strain (CARDIA)    Difficulty of Paying Living Expenses: Not hard at all  Food Insecurity: No Food Insecurity (05/25/2024)   Received from [facility]   Hunger Vital Sign    Within the past 12 months, you worried that your food would run out before you got the money to buy more.: Never true    Within the past 12 months, the food you bought just didn't last and you didn't have money to get more.: Never true  Transportation Needs: No Transportation Needs (05/25/2024)   Received from [facility] - Transportation    In the past 12 months, has lack of transportation kept you from medical appointments or from getting medications?: No    Lack of Transportation (Non-Medical): No  Physical Activity: Not on file  Stress: Not on file  Social Connections: Not on file  Intimate Partner Violence: Not on file    FAMILY HISTORY: Family History  Problem Relation Age of Onset   Diabetes Mother    Heart attack Mother    Osteoporosis Mother    Stroke Mother    Ulcers Father    COPD Father    Breast cancer Sister 47   Diabetes Sister    Breast cancer Maternal Aunt     ALLERGIES:  is allergic to ace inhibitors, penicillins, sulfa antibiotics, trazodone, and amlodipine.  MEDICATIONS:  Current Outpatient Medications  Medication Sig Dispense Refill   Carboxymeth-Glyc-Polysorb PF (REFRESH OPTIVE MEGA-3) 0.5-1-0.5 % SOLN Apply to eye.     Cholecalciferol  (VITAMIN D3) 2000 units capsule Take 1 capsule by mouth daily.     clobetasol cream (TEMOVATE) 0.05 %  Apply 1 application topically 2 (two) times daily. (Patient taking differently: Apply 1 application  topically 2 (two) times a week.)     Cyanocobalamin  (VITAMIN B-12 IJ) Inject as directed every 30 (thirty) days.     doxazosin (CARDURA) 2 MG tablet Take 2 mg by mouth daily.     escitalopram (LEXAPRO) 5 MG tablet Take 10 mg by mouth daily. (Patient taking differently: Take 2.5 mg by mouth daily.)     famotidine (PEPCID) 40 MG tablet Take 40 mg by mouth at bedtime.     levothyroxine  (SYNTHROID , LEVOTHROID) 100 MCG tablet Take 1 tablet by mouth daily.     loperamide (IMODIUM A-D) 2 MG tablet Take by mouth.     loratadine  (CLARITIN ) 10 MG tablet Take 1 tablet by mouth daily.     LORazepam  (ATIVAN ) 0.5 MG tablet Take by mouth. (Patient taking differently: Take 0.5 mg by mouth daily as needed for anxiety.)     metoprolol  succinate (TOPROL -XL) 50 MG 24 hr tablet Take 25 mg by mouth daily. Take 1 every morning and 1/2 qhs (Patient taking  differently: Take 50 mg by mouth daily.)     pantoprazole  (PROTONIX ) 40 MG tablet Take 40 mg by mouth daily.     simvastatin  (ZOCOR ) 10 MG tablet Take 1 tablet by mouth daily.     telmisartan-hydrochlorothiazide (MICARDIS HCT) 80-12.5 MG tablet Take 1 tablet by mouth daily. Take 1/2 tablet qd     triamcinolone cream (KENALOG) 0.1 % Apply 1 Application topically 2 (two) times daily.     DOCUSATE SODIUM PO Take by mouth. (Patient not taking: Reported on 07/11/2024)     No current facility-administered medications for this visit.   Facility-Administered Medications Ordered in Other Visits  Medication Dose Route Frequency Provider Last Rate Last Admin   sodium chloride  flush (NS) 0.9 % injection 10 mL  10 mL Intracatheter Once PRN Greyson Peavy R, MD          PHYSICAL EXAMINATION:   Vitals:   07/11/24 1239  BP: (!) 179/65  Pulse: 65  Resp: 17  Temp: 97.8 F (36.6 C)  SpO2: 98%    Filed Weights   07/11/24 1239  Weight: 174 lb 6.4 oz (79.1 kg)      Physical Exam Vitals and nursing note reviewed.  HENT:     Head: Normocephalic and atraumatic.     Mouth/Throat:     Pharynx: Oropharynx is clear.  Eyes:     Extraocular Movements: Extraocular movements intact.     Pupils: Pupils are equal, round, and reactive to light.  Cardiovascular:     Rate and Rhythm: Normal rate and regular rhythm.     Heart sounds: Murmur heard.  Pulmonary:     Comments: Decreased breath sounds bilaterally.  Abdominal:     Palpations: Abdomen is soft.  Musculoskeletal:        General: Normal range of motion.     Cervical back: Normal range of motion.  Skin:    General: Skin is warm.  Neurological:     General: No focal deficit present.     Mental Status: She is alert and oriented to person, place, and time.  Psychiatric:        Behavior: Behavior normal.        Judgment: Judgment normal.     LABORATORY DATA:  I have reviewed the data as listed Lab Results  Component Value Date   WBC 7.3 07/11/2024   HGB 10.2 (L) 07/11/2024   HCT 31.9 (L) 07/11/2024   MCV 95.8 07/11/2024   PLT 224 07/11/2024   Recent Labs    01/11/24 1236 04/11/24 1250 07/11/24 1228  NA 132* 136 127*  K 4.0 4.5 4.3  CL 103 105 98  CO2 23 24 23   GLUCOSE 95 102* 97  BUN 30* 29* 33*  CREATININE 1.25* 1.54* 1.42*  CALCIUM 9.1 9.0 9.1  GFRNONAA 43* 33* 36*     No results found.  Symptomatic anemia # Chronic mild anemia - likely from IDA/CKD- III [Kappa lambda light chain ratio slightly abnormal-likely secondary insufficiency; M protein negative]; no Bone marrow Biopsy-  Jan 2023-hemoglobin 9-10; not on oral iron .  Poor tolerance.  S/p venofer - Hb improved ;  # Currently on venofer / Retacrit  -   # CKD-stage III/chronic Hyponatremia: - ?  Etiology- recommend gatorade; limit free water/salt tablets []    Left Knee pain- defer [Dr.Hooten]- Avoid motrin/tylenol  not helping- ok with volatren gel-.  # Elevated BP- recommend checking at home; and follow up with PCP-    # B12 def [2019; PCP]-on B12 injections.- every month per  PCP>    # DISPOSITION: # NO retacrit -; OK venofer  today # monthly x 4 more labs- H&H- Possible retacrit   # follow up in 4  months MD; lab- cbc/bmp; iron  studies/ferritin; b12 levels  possible venofer  or retacrit  -- Dr.B    All questions were answered. The patient knows to call the clinic with any problems, questions or concerns.    Cindy JONELLE Joe, MD 07/11/2024 1:51 PM

## 2024-07-11 NOTE — Assessment & Plan Note (Signed)
#   Chronic mild anemia - likely from IDA/CKD- III [Kappa lambda light chain ratio slightly abnormal-likely secondary insufficiency; M protein negative]; no Bone marrow Biopsy-  Jan 2023-hemoglobin 9-10; not on oral iron .  Poor tolerance.  S/p venofer - Hb improved ;  # Currently on venofer / Retacrit  -   # CKD-stage III/chronic Hyponatremia: - ?  Etiology- recommend gatorade; limit free water/salt tablets []    Left Knee pain- defer [Dr.Hooten]- Avoid motrin/tylenol  not helping- ok with volatren gel-.  # Elevated BP- recommend checking at home; and follow up with PCP-   # B12 def [2019; PCP]-on B12 injections.- every month per PCP>    # DISPOSITION: # NO retacrit -; OK venofer  today # monthly x 4 more labs- H&H- Possible retacrit   # follow up in 4  months MD; lab- cbc/bmp; iron  studies/ferritin; b12 levels  possible venofer  or retacrit  -- Dr.B

## 2024-07-11 NOTE — Progress Notes (Signed)
 Patient has no concerns

## 2024-08-10 ENCOUNTER — Inpatient Hospital Stay: Attending: Internal Medicine

## 2024-08-10 ENCOUNTER — Inpatient Hospital Stay

## 2024-08-10 VITALS — BP 123/70 | HR 67

## 2024-08-10 DIAGNOSIS — D631 Anemia in chronic kidney disease: Secondary | ICD-10-CM | POA: Insufficient documentation

## 2024-08-10 DIAGNOSIS — D649 Anemia, unspecified: Secondary | ICD-10-CM

## 2024-08-10 DIAGNOSIS — E538 Deficiency of other specified B group vitamins: Secondary | ICD-10-CM | POA: Insufficient documentation

## 2024-08-10 DIAGNOSIS — Z23 Encounter for immunization: Secondary | ICD-10-CM | POA: Insufficient documentation

## 2024-08-10 DIAGNOSIS — N184 Chronic kidney disease, stage 4 (severe): Secondary | ICD-10-CM | POA: Diagnosis present

## 2024-08-10 LAB — HEMOGLOBIN AND HEMATOCRIT (CANCER CENTER ONLY)
HCT: 29.9 % — ABNORMAL LOW (ref 36.0–46.0)
Hemoglobin: 9.7 g/dL — ABNORMAL LOW (ref 12.0–15.0)

## 2024-08-10 MED ORDER — EPOETIN ALFA-EPBX 20000 UNIT/ML IJ SOLN
20000.0000 [IU] | Freq: Once | INTRAMUSCULAR | Status: AC
  Administered 2024-08-10: 20000 [IU] via SUBCUTANEOUS
  Filled 2024-08-10: qty 1

## 2024-08-10 MED ORDER — INFLUENZA VAC SPLIT HIGH-DOSE 0.5 ML IM SUSY
0.5000 mL | PREFILLED_SYRINGE | Freq: Once | INTRAMUSCULAR | Status: AC
  Administered 2024-08-10: 0.5 mL via INTRAMUSCULAR
  Filled 2024-08-10: qty 0.5

## 2024-08-31 ENCOUNTER — Other Ambulatory Visit: Payer: Self-pay | Admitting: Obstetrics and Gynecology

## 2024-08-31 DIAGNOSIS — Z1231 Encounter for screening mammogram for malignant neoplasm of breast: Secondary | ICD-10-CM

## 2024-09-03 ENCOUNTER — Telehealth: Payer: Self-pay | Admitting: Internal Medicine

## 2024-09-03 NOTE — Telephone Encounter (Signed)
 Pt called to confirm appts for next Monday - confirmed in system - [facility]

## 2024-09-10 ENCOUNTER — Inpatient Hospital Stay

## 2024-09-10 ENCOUNTER — Inpatient Hospital Stay: Attending: Internal Medicine

## 2024-09-10 VITALS — BP 119/69

## 2024-09-10 DIAGNOSIS — E538 Deficiency of other specified B group vitamins: Secondary | ICD-10-CM | POA: Diagnosis present

## 2024-09-10 DIAGNOSIS — N184 Chronic kidney disease, stage 4 (severe): Secondary | ICD-10-CM | POA: Diagnosis present

## 2024-09-10 DIAGNOSIS — D649 Anemia, unspecified: Secondary | ICD-10-CM

## 2024-09-10 DIAGNOSIS — D631 Anemia in chronic kidney disease: Secondary | ICD-10-CM | POA: Diagnosis present

## 2024-09-10 LAB — HEMOGLOBIN AND HEMATOCRIT (CANCER CENTER ONLY)
HCT: 29.7 % — ABNORMAL LOW (ref 36.0–46.0)
Hemoglobin: 9.4 g/dL — ABNORMAL LOW (ref 12.0–15.0)

## 2024-09-10 MED ORDER — EPOETIN ALFA-EPBX 20000 UNIT/ML IJ SOLN
20000.0000 [IU] | Freq: Once | INTRAMUSCULAR | Status: AC
  Administered 2024-09-10: 20000 [IU] via SUBCUTANEOUS
  Filled 2024-09-10: qty 1

## 2024-10-02 ENCOUNTER — Encounter

## 2024-10-10 ENCOUNTER — Inpatient Hospital Stay: Attending: Internal Medicine

## 2024-10-10 ENCOUNTER — Inpatient Hospital Stay

## 2024-10-10 VITALS — BP 166/57

## 2024-10-10 DIAGNOSIS — N184 Chronic kidney disease, stage 4 (severe): Secondary | ICD-10-CM | POA: Diagnosis present

## 2024-10-10 DIAGNOSIS — D631 Anemia in chronic kidney disease: Secondary | ICD-10-CM | POA: Diagnosis present

## 2024-10-10 DIAGNOSIS — D649 Anemia, unspecified: Secondary | ICD-10-CM

## 2024-10-10 DIAGNOSIS — E538 Deficiency of other specified B group vitamins: Secondary | ICD-10-CM | POA: Insufficient documentation

## 2024-10-10 LAB — HEMOGLOBIN AND HEMATOCRIT (CANCER CENTER ONLY)
HCT: 27.9 % — ABNORMAL LOW (ref 36.0–46.0)
Hemoglobin: 8.8 g/dL — ABNORMAL LOW (ref 12.0–15.0)

## 2024-10-10 MED ORDER — EPOETIN ALFA-EPBX 20000 UNIT/ML IJ SOLN
20000.0000 [IU] | Freq: Once | INTRAMUSCULAR | Status: AC
  Administered 2024-10-10: 14:00:00 20000 [IU] via SUBCUTANEOUS
  Filled 2024-10-10: qty 1

## 2024-10-10 NOTE — Progress Notes (Signed)
 Patient with BP of 166/57.  Discussed with Dr. Rennie to proceed with injection of Retacrit .  Injection given.  Discussed to follow up with PCP for elevated systolic blood pressure.  She is agreeable.

## 2024-10-25 ENCOUNTER — Encounter: Payer: Self-pay | Admitting: Internal Medicine

## 2024-10-31 ENCOUNTER — Encounter: Payer: Self-pay | Admitting: Internal Medicine

## 2024-10-31 ENCOUNTER — Ambulatory Visit
Admission: RE | Admit: 2024-10-31 | Discharge: 2024-10-31 | Disposition: A | Discharge status: Home / Self Care | Source: Ambulatory Visit | Attending: Obstetrics and Gynecology | Admitting: Obstetrics and Gynecology

## 2024-10-31 DIAGNOSIS — Z1231 Encounter for screening mammogram for malignant neoplasm of breast: Secondary | ICD-10-CM | POA: Insufficient documentation

## 2024-11-09 ENCOUNTER — Inpatient Hospital Stay: Attending: Internal Medicine

## 2024-11-09 ENCOUNTER — Inpatient Hospital Stay (HOSPITAL_BASED_OUTPATIENT_CLINIC_OR_DEPARTMENT_OTHER): Admitting: Internal Medicine

## 2024-11-09 ENCOUNTER — Inpatient Hospital Stay

## 2024-11-09 ENCOUNTER — Encounter: Payer: Self-pay | Admitting: Internal Medicine

## 2024-11-09 VITALS — BP 171/79 | HR 63 | Temp 97.0°F | Ht 62.0 in | Wt 179.0 lb

## 2024-11-09 DIAGNOSIS — Z85820 Personal history of malignant melanoma of skin: Secondary | ICD-10-CM | POA: Diagnosis not present

## 2024-11-09 DIAGNOSIS — N184 Chronic kidney disease, stage 4 (severe): Secondary | ICD-10-CM | POA: Diagnosis present

## 2024-11-09 DIAGNOSIS — D649 Anemia, unspecified: Secondary | ICD-10-CM

## 2024-11-09 DIAGNOSIS — D631 Anemia in chronic kidney disease: Secondary | ICD-10-CM | POA: Insufficient documentation

## 2024-11-09 DIAGNOSIS — E538 Deficiency of other specified B group vitamins: Secondary | ICD-10-CM | POA: Insufficient documentation

## 2024-11-09 LAB — CBC WITH DIFFERENTIAL (CANCER CENTER ONLY)
Abs Immature Granulocytes: 0.04 K/uL (ref 0.00–0.07)
Basophils Absolute: 0 K/uL (ref 0.0–0.1)
Basophils Relative: 1 %
Eosinophils Absolute: 0.1 K/uL (ref 0.0–0.5)
Eosinophils Relative: 1 %
HCT: 29.5 % — ABNORMAL LOW (ref 36.0–46.0)
Hemoglobin: 9.4 g/dL — ABNORMAL LOW (ref 12.0–15.0)
Immature Granulocytes: 1 %
Lymphocytes Relative: 26 %
Lymphs Abs: 2.1 K/uL (ref 0.7–4.0)
MCH: 30.5 pg (ref 26.0–34.0)
MCHC: 31.9 g/dL (ref 30.0–36.0)
MCV: 95.8 fL (ref 80.0–100.0)
Monocytes Absolute: 0.9 K/uL (ref 0.1–1.0)
Monocytes Relative: 11 %
Neutro Abs: 4.9 K/uL (ref 1.7–7.7)
Neutrophils Relative %: 60 %
Platelet Count: 190 K/uL (ref 150–400)
RBC: 3.08 MIL/uL — ABNORMAL LOW (ref 3.87–5.11)
RDW: 12.9 % (ref 11.5–15.5)
WBC Count: 8.1 K/uL (ref 4.0–10.5)
nRBC: 0 % (ref 0.0–0.2)

## 2024-11-09 LAB — BASIC METABOLIC PANEL - CANCER CENTER ONLY
Anion gap: 10 (ref 5–15)
BUN: 26 mg/dL — ABNORMAL HIGH (ref 8–23)
CO2: 22 mmol/L (ref 22–32)
Calcium: 9.5 mg/dL (ref 8.9–10.3)
Chloride: 101 mmol/L (ref 98–111)
Creatinine: 1.41 mg/dL — ABNORMAL HIGH (ref 0.44–1.00)
GFR, Estimated: 36 mL/min — ABNORMAL LOW
Glucose, Bld: 101 mg/dL — ABNORMAL HIGH (ref 70–99)
Potassium: 4.7 mmol/L (ref 3.5–5.1)
Sodium: 132 mmol/L — ABNORMAL LOW (ref 135–145)

## 2024-11-09 LAB — IRON AND TIBC
Iron: 53 ug/dL (ref 28–170)
Saturation Ratios: 19 % (ref 10.4–31.8)
TIBC: 279 ug/dL (ref 250–450)
UIBC: 225 ug/dL

## 2024-11-09 LAB — FERRITIN: Ferritin: 436 ng/mL — ABNORMAL HIGH (ref 11–307)

## 2024-11-09 LAB — VITAMIN B12: Vitamin B-12: 660 pg/mL (ref 180–914)

## 2024-11-09 MED ORDER — EPOETIN ALFA-EPBX 20000 UNIT/ML IJ SOLN
20000.0000 [IU] | Freq: Once | INTRAMUSCULAR | Status: AC
  Administered 2024-11-09: 20000 [IU] via SUBCUTANEOUS
  Filled 2024-11-09: qty 1

## 2024-11-09 NOTE — Addendum Note (Signed)
 Addended by: JOSHUA ALFONSO CROME on: 11/09/2024 02:31 PM   Modules accepted: Orders

## 2024-11-09 NOTE — Assessment & Plan Note (Signed)
#   Chronic mild anemia - likely from IDA/CKD- III [Kappa lambda light chain ratio slightly abnormal-likely secondary insufficiency; M protein negative]; no Bone marrow Biopsy-  Jan 2023-hemoglobin 9-10; not on oral iron .  Poor tolerance.  S/p venofer - Hb improved ;  # Currently on venofer / Retacrit  - hb 10.2.   # CKD-stage III/chronic Hyponatremia: - ?  Etiology- recommend gatorade; limit free water/salt tablets []    Left Knee pain- defer [Dr.Hooten]- Avoid motrin/tylenol  not helping- ok with volatren gel-.  # Elevated BP- recommend checking at home; and follow up with PCP-   # B12 def [2019; PCP]-on B12 injections.- every month per PCP>    # DISPOSITION: # retacrit -; NOvenofer today # monthly x 4 more labs- H&H- Possible retacrit   # follow up in 4  months MD; lab- cbc/bmp; iron  studies/ferritin; b12 levels  possible venofer  or retacrit  -- Dr.B

## 2024-11-09 NOTE — Progress Notes (Signed)
 [facility] CONSULT NOTE  Patient Care Team: Nicole Oneil FALCON, MD as PCP - General (Internal Medicine) Nicole Nicole SAUNDERS, MD as Consulting Physician (Hematology)  CHIEF COMPLAINTS/PURPOSE OF CONSULTATION: ANEMIA  HEMATOLOGY HISTORY:  # CHRONIC ANEMIA [2019- Hb-10]; JAN 2023- 9.6; GFR ~40s; EGD-; colonoscopy-2010 [normal; Nicole Shaw];  CKD versus others.  Kappa lambda light chain ratio slightly abnormal-likely secondary insufficiency; M protein negative.  Clinically not suggestive of multiple myeloma.  Would not recommend a bone marrow biopsy at this time.  # CKD-stage III/chronic Hyponatremia: -[109 in past- Nicole Shaw-ICU] ?  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  Ambulating independently.  Nicole Shaw 84 y.o.  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  Discussed the use of AI scribe software for clinical note transcription with the patient, who gave verbal consent to proceed.  History of Present Illness   Nicole Shaw is an 84 year old female with chronic anemia secondary to stage 3 chronic kidney disease who presents for hematology follow-up.  Hemoglobin levels have remained stable, with a most recent value of 9.4 g/dL and a prior value of 8.8 g/dL. She receives regular erythropoiesis-stimulating agent injections, which she tolerates well without significant adverse effects. She denies new or worsening symptoms related to anemia.  She continues to manage stage 3 chronic kidney disease. No acute changes or new symptoms related to renal function were reported at this visit.  She experiences elevated blood pressure readings in the clinic, which she attributes to white coat  syndrome, and does not observe similar elevations at home. She is currently on a multi-agent antihypertensive regimen and plans to discuss medication timing with her primary care provider.       Review of Systems  Constitutional:  Positive for malaise/fatigue. Negative for chills, diaphoresis, fever and weight loss.  HENT:  Negative for nosebleeds and sore throat.   Eyes:  Negative for double vision.  Respiratory:  Negative for cough, hemoptysis, sputum production, shortness of breath and wheezing.   Cardiovascular:  Negative for chest pain, palpitations, orthopnea and leg swelling.  Gastrointestinal:  Negative for abdominal pain, blood in stool, constipation, diarrhea, heartburn, melena, nausea and vomiting.  Genitourinary:  Negative for dysuria, frequency and urgency.  Musculoskeletal:  Positive for back pain and joint pain.  Skin: Negative.  Negative for itching and rash.  Neurological:  Negative for dizziness, tingling, focal weakness, weakness and headaches.  Endo/Heme/Allergies:  Does not bruise/bleed easily.  Psychiatric/Behavioral:  Negative for depression. The patient is not nervous/anxious and does not have insomnia.     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)    Panic attacks    Pernicious anemia     SURGICAL HISTORY: Past Surgical  History:  Procedure Laterality Date   CATARACT EXTRACTION W/PHACO Left 05/19/2021   Procedure: CATARACT EXTRACTION PHACO AND INTRAOCULAR LENS PLACEMENT (IOC) LEFT  DIABETIC VIVITY LENS  7.37 01:04.6;  Surgeon: Nicole Fallow, MD;  Location: [facility];  Service: Ophthalmology;  Laterality: Left;   CATARACT EXTRACTION W/PHACO Right 06/02/2021   Procedure: CATARACT EXTRACTION PHACO AND INTRAOCULAR LENS PLACEMENT (IOC) RIGHT DIABETIC VIVITY LENS 10.95 01:06.0;  Surgeon: Nicole Fallow, MD;  Location: [facility];  Service: Ophthalmology;  Laterality: Right;   KNEE SURGERY     THYROIDECTOMY      SOCIAL HISTORY: Social History   Socioeconomic History   Marital status: Single    Spouse name: Not on file   Number of children: Not on file   Years of education: Not on file   Highest education level: Not on file  Occupational History   Not on file  Tobacco Use   Smoking status: Never    Passive exposure: Never   Smokeless tobacco: Never  Vaping Use   Vaping status: Never Used  Substance and Sexual Activity   Alcohol use: Never   Drug use: Never   Sexual activity: Not on file  Other Topics Concern   Not on file  Social History Narrative   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    Social Drivers of Health   Tobacco Use: Low Risk (11/09/2024)   Patient History    Smoking Tobacco Use: Never    Smokeless Tobacco Use: Never    Passive Exposure: Never  Financial Resource Strain: Low Risk  (05/25/2024)   Received from [facility]   Overall Financial Resource Strain (CARDIA)    Difficulty of Paying Living Expenses: Not hard at all  Food Insecurity: No Food Insecurity (05/25/2024)   Received from [facility]   Epic    Within the past 12 months, you worried that your food would run out before you got the money to buy more.: Never true    Within the past 12 months, the food you bought just didn't last and you didn't have money to get more.: Never true  Transportation Needs: No Transportation Needs (05/25/2024)   Received from [facility] - Transportation    In the past 12  months, has lack of transportation kept you from medical appointments or from getting medications?: No    Lack of Transportation (Non-Medical): No  Physical Activity: Not on file  Stress: Not on file  Social Connections: Not on file  Intimate Partner Violence: Not on file  Depression (PHQ2-9): Low Risk (11/09/2024)   Depression (PHQ2-9)    PHQ-2 Score: 0  Alcohol Screen: Not on file  Housing: Low Risk  (05/25/2024)   Received from [facility]   Epic    In the last 12 months, was there a time when you were not able to pay the mortgage or rent on time?: No    In the past 12 months, how many times have you moved where you were living?: 0    At any time in the past 12 months, were you homeless or living in a shelter (including now)?: No  Utilities: Not At Risk (05/25/2024)   Received from [facility]   Epic    In the past 12 months has the electric, gas, oil, or water company threatened to shut off services in your home?: No  Health Literacy: Not on file  FAMILY HISTORY: Family History  Problem Relation Age of Onset   Diabetes Mother    Heart attack Mother    Osteoporosis Mother    Stroke Mother    Ulcers Father    COPD Father    Breast cancer Sister 96   Diabetes Sister    Breast cancer Maternal Aunt     ALLERGIES:  is allergic to ace inhibitors, penicillins, sulfa antibiotics, trazodone, and amlodipine.  MEDICATIONS:  Current Outpatient Medications  Medication Sig Dispense Refill   Carboxymeth-Glyc-Polysorb PF (REFRESH OPTIVE MEGA-3) 0.5-1-0.5 % SOLN Apply to eye.     Cholecalciferol  (VITAMIN D3) 2000 units capsule Take 1 capsule by mouth daily.     clobetasol cream (TEMOVATE) 0.05 % Apply 1 application topically 2 (two) times daily. (Patient taking differently: Apply 1 application  topically 2 (two) times a week.)     Cyanocobalamin  (VITAMIN B-12 IJ) Inject as directed every 30 (thirty) days.     doxazosin (CARDURA) 2 MG tablet Take 2 mg  by mouth daily.     escitalopram (LEXAPRO) 5 MG tablet Take 10 mg by mouth daily. (Patient taking differently: Take 2.5 mg by mouth daily.)     famotidine (PEPCID) 40 MG tablet Take 40 mg by mouth at bedtime.     levothyroxine  (SYNTHROID , LEVOTHROID) 100 MCG tablet Take 1 tablet by mouth daily.     loperamide (IMODIUM A-D) 2 MG tablet Take by mouth.     loratadine  (CLARITIN ) 10 MG tablet Take 1 tablet by mouth daily.     LORazepam  (ATIVAN ) 0.5 MG tablet Take by mouth. (Patient taking differently: Take 0.5 mg by mouth daily as needed for anxiety.)     metoprolol  succinate (TOPROL -XL) 50 MG 24 hr tablet Take 25 mg by mouth daily. Take 1 every morning and 1/2 qhs (Patient taking differently: Take 50 mg by mouth daily.)     pantoprazole  (PROTONIX ) 40 MG tablet Take 40 mg by mouth daily.     simvastatin  (ZOCOR ) 10 MG tablet Take 1 tablet by mouth daily.     telmisartan-hydrochlorothiazide (MICARDIS HCT) 80-12.5 MG tablet Take 1 tablet by mouth daily. Take 1/2 tablet qd     triamcinolone cream (KENALOG) 0.1 % Apply 1 Application topically 2 (two) times daily.     DOCUSATE SODIUM PO Take by mouth. (Patient not taking: Reported on 11/09/2024)     No current facility-administered medications for this visit.      PHYSICAL EXAMINATION:   Vitals:   11/09/24 1314 11/09/24 1351  BP: (!) 168/81 (!) 171/79  Pulse: 63   Temp: (!) 97 F (36.1 C)   SpO2: 99%     Filed Weights   11/09/24 1314  Weight: 179 lb (81.2 kg)     Physical Exam Vitals and nursing note reviewed.  HENT:     Head: Normocephalic and atraumatic.     Mouth/Throat:     Pharynx: Oropharynx is clear.  Eyes:     Extraocular Movements: Extraocular movements intact.     Pupils: Pupils are equal, round, and reactive to light.  Cardiovascular:     Rate and Rhythm: Normal rate and regular rhythm.     Heart sounds: Murmur heard.  Pulmonary:     Comments: Decreased breath sounds bilaterally.  Abdominal:     Palpations: Abdomen  is soft.  Musculoskeletal:        General: Normal range of motion.     Cervical back: Normal range of motion.  Skin:    General: Skin is  warm.  Neurological:     General: No focal deficit present.     Mental Status: She is alert and oriented to person, place, and time.  Psychiatric:        Behavior: Behavior normal.        Judgment: Judgment normal.     LABORATORY DATA:  I have reviewed the data as listed Lab Results  Component Value Date   WBC 8.1 11/09/2024   HGB 9.4 (L) 11/09/2024   HCT 29.5 (L) 11/09/2024   MCV 95.8 11/09/2024   PLT 190 11/09/2024   Recent Labs    04/11/24 1250 07/11/24 1228 11/09/24 1323  NA 136 127* 132*  K 4.5 4.3 4.7  CL 105 98 101  CO2 24 23 22   GLUCOSE 102* 97 101*  BUN 29* 33* 26*  CREATININE 1.54* 1.42* 1.41*  CALCIUM 9.0 9.1 9.5  GFRNONAA 33* 36* 36*     MM 3D SCREENING MAMMOGRAM BILATERAL BREAST Result Date: 11/02/2024 CLINICAL DATA:  Screening. EXAM: DIGITAL SCREENING BILATERAL MAMMOGRAM WITH TOMOSYNTHESIS AND CAD TECHNIQUE: Bilateral screening digital craniocaudal and mediolateral oblique mammograms were obtained. Bilateral screening digital breast tomosynthesis was performed. The images were evaluated with computer-aided detection. COMPARISON:  Previous exam(s). ACR Breast Density Category b: There are scattered areas of fibroglandular density. FINDINGS: There are no findings suspicious for malignancy. IMPRESSION: No mammographic evidence of malignancy. A result letter of this screening mammogram will be mailed directly to the patient. RECOMMENDATION: Screening mammogram in one year. (Code:[id number]) BI-RADS CATEGORY  1: Negative. Electronically Signed   By: Alm Parkins M.D.   On: 11/02/2024 11:39    Symptomatic anemia # Chronic mild anemia - likely from IDA/CKD- III [Kappa lambda light chain ratio slightly abnormal-likely secondary insufficiency; M protein negative]; no Bone marrow Biopsy-  Jan 2023-hemoglobin 9-10; not on oral iron .   Poor tolerance.  S/p venofer - Hb improved ;  # Currently on venofer / Retacrit  - hb 10.2.   # CKD-stage III/chronic Hyponatremia: - ?  Etiology- recommend gatorade; limit free water/salt tablets []    Left Knee pain- defer [Dr.Hooten]- Avoid motrin/tylenol  not helping- ok with volatren gel-.  # Elevated BP- recommend checking at home; and follow up with PCP-   # B12 def [2019; PCP]-on B12 injections.- every month per PCP>    # DISPOSITION: # retacrit -; NOvenofer today # monthly x 4 more labs- H&H- Possible retacrit   # follow up in 4  months MD; lab- cbc/bmp; iron  studies/ferritin; b12 levels  possible venofer  or retacrit  -- Dr.B     All questions were answered. The patient knows to call the clinic with any problems, questions or concerns.    Nicole JONELLE Joe, MD 11/09/2024 2:07 PM

## 2024-11-09 NOTE — Progress Notes (Signed)
 Patient states questions regarding her prior injections and if she needs to continue them or if there are any side effects.

## 2024-12-10 ENCOUNTER — Inpatient Hospital Stay

## 2025-01-07 ENCOUNTER — Inpatient Hospital Stay

## 2025-02-07 ENCOUNTER — Inpatient Hospital Stay

## 2025-03-08 ENCOUNTER — Inpatient Hospital Stay

## 2025-03-08 ENCOUNTER — Inpatient Hospital Stay: Admitting: Internal Medicine
